# Patient Record
Sex: Female | Born: 1985 | Race: Black or African American | Hispanic: No | Marital: Single | Smoking: Never smoker
Health system: Southern US, Community
[De-identification: ages and names within clinical notes are randomized; demographics above are authoritative.]

## PROBLEM LIST (undated history)

## (undated) DIAGNOSIS — I639 Cerebral infarction, unspecified: Secondary | ICD-10-CM

## (undated) HISTORY — PX: TONSILLECTOMY: SUR1361

---

## 2014-09-24 ENCOUNTER — Inpatient Hospital Stay (HOSPITAL_COMMUNITY)
Admission: EM | Admit: 2014-09-24 | Discharge: 2014-09-27 | DRG: 064 | Disposition: A | Discharge status: Home / Self Care | Payer: Commercial Managed Care - HMO | Attending: Internal Medicine | Admitting: Internal Medicine

## 2014-09-24 ENCOUNTER — Encounter (HOSPITAL_COMMUNITY): Payer: Self-pay

## 2014-09-24 ENCOUNTER — Inpatient Hospital Stay (HOSPITAL_COMMUNITY): Payer: Commercial Managed Care - HMO

## 2014-09-24 ENCOUNTER — Emergency Department (HOSPITAL_COMMUNITY): Payer: Commercial Managed Care - HMO

## 2014-09-24 DIAGNOSIS — I7771 Dissection of carotid artery: Secondary | ICD-10-CM | POA: Diagnosis present

## 2014-09-24 DIAGNOSIS — I10 Essential (primary) hypertension: Secondary | ICD-10-CM | POA: Diagnosis present

## 2014-09-24 DIAGNOSIS — I638 Other cerebral infarction: Secondary | ICD-10-CM | POA: Diagnosis not present

## 2014-09-24 DIAGNOSIS — I639 Cerebral infarction, unspecified: Secondary | ICD-10-CM | POA: Diagnosis not present

## 2014-09-24 DIAGNOSIS — I63511 Cerebral infarction due to unspecified occlusion or stenosis of right middle cerebral artery: Principal | ICD-10-CM | POA: Diagnosis present

## 2014-09-24 DIAGNOSIS — R112 Nausea with vomiting, unspecified: Secondary | ICD-10-CM | POA: Diagnosis present

## 2014-09-24 DIAGNOSIS — D649 Anemia, unspecified: Secondary | ICD-10-CM | POA: Diagnosis present

## 2014-09-24 DIAGNOSIS — E785 Hyperlipidemia, unspecified: Secondary | ICD-10-CM | POA: Diagnosis present

## 2014-09-24 DIAGNOSIS — I6789 Other cerebrovascular disease: Secondary | ICD-10-CM | POA: Diagnosis not present

## 2014-09-24 DIAGNOSIS — Z91013 Allergy to seafood: Secondary | ICD-10-CM | POA: Diagnosis not present

## 2014-09-24 LAB — CBC
HEMATOCRIT: 35.2 % — AB (ref 36.0–46.0)
HEMATOCRIT: 32 % — AB (ref 36.0–46.0)
HEMOGLOBIN: 10.5 g/dL — AB (ref 12.0–15.0)
Hemoglobin: 11.1 g/dL — ABNORMAL LOW (ref 12.0–15.0)
MCH: 26.3 pg (ref 26.0–34.0)
MCH: 27.1 pg (ref 26.0–34.0)
MCHC: 31.5 g/dL (ref 30.0–36.0)
MCHC: 32.8 g/dL (ref 30.0–36.0)
MCV: 83.4 fL (ref 78.0–100.0)
MCV: 82.5 fL (ref 78.0–100.0)
PLATELETS: 255 10*3/uL (ref 150–400)
Platelets: 245 10*3/uL (ref 150–400)
RBC: 4.22 MIL/uL (ref 3.87–5.11)
RBC: 3.88 MIL/uL (ref 3.87–5.11)
RDW: 17.3 % — AB (ref 11.5–15.5)
RDW: 17.4 % — ABNORMAL HIGH (ref 11.5–15.5)
WBC: 9.2 10*3/uL (ref 4.0–10.5)
WBC: 8.6 10*3/uL (ref 4.0–10.5)

## 2014-09-24 LAB — CREATININE, SERUM: CREATININE: 0.69 mg/dL (ref 0.44–1.00)

## 2014-09-24 LAB — COMPREHENSIVE METABOLIC PANEL
ALK PHOS: 56 U/L (ref 38–126)
ALT: 15 U/L (ref 14–54)
AST: 15 U/L (ref 15–41)
Albumin: 4.3 g/dL (ref 3.5–5.0)
Anion gap: 6 (ref 5–15)
BILIRUBIN TOTAL: 0.5 mg/dL (ref 0.3–1.2)
BUN: 12 mg/dL (ref 6–20)
CALCIUM: 9.7 mg/dL (ref 8.9–10.3)
CO2: 27 mmol/L (ref 22–32)
Chloride: 108 mmol/L (ref 101–111)
Creatinine, Ser: 0.74 mg/dL (ref 0.44–1.00)
GFR calc Af Amer: >60 mL/min (ref >60)
Glucose, Bld: 102 mg/dL — ABNORMAL HIGH (ref 65–99)
POTASSIUM: 3.5 mmol/L (ref 3.5–5.1)
Sodium: 141 mmol/L (ref 135–145)
TOTAL PROTEIN: 8.5 g/dL — AB (ref 6.5–8.1)

## 2014-09-24 LAB — URINALYSIS, ROUTINE W REFLEX MICROSCOPIC
BILIRUBIN URINE: NEGATIVE
Glucose, UA: NEGATIVE mg/dL
HGB URINE DIPSTICK: NEGATIVE
KETONES UR: NEGATIVE mg/dL
Leukocytes, UA: NEGATIVE
NITRITE: NEGATIVE
PROTEIN: NEGATIVE mg/dL
SPECIFIC GRAVITY, URINE: 1.016 (ref 1.005–1.030)
UROBILINOGEN UA: 0.2 mg/dL (ref 0.0–1.0)
pH: 5.5 (ref 5.0–8.0)

## 2014-09-24 LAB — I-STAT BETA HCG BLOOD, ED (MC, WL, AP ONLY)

## 2014-09-24 LAB — PROTIME-INR
INR: 1.12 (ref 0.00–1.49)
Prothrombin Time: 14.6 seconds (ref 11.6–15.2)

## 2014-09-24 LAB — PREGNANCY, URINE: PREG TEST UR: NEGATIVE

## 2014-09-24 MED ORDER — POTASSIUM CHLORIDE IN NACL 20-0.45 MEQ/L-% IV SOLN
INTRAVENOUS | Status: DC
  Administered 2014-09-24: 22:00:00 via INTRAVENOUS
  Filled 2014-09-24 (×3): qty 1000

## 2014-09-24 MED ORDER — ONDANSETRON HCL 4 MG PO TABS: 4.0000 mg | ORAL_TABLET | Freq: Four times a day (QID) | ORAL | Status: DC | PRN

## 2014-09-24 MED ORDER — MORPHINE SULFATE (PF) 2 MG/ML IV SOLN
2.0000 mg | Freq: Once | INTRAVENOUS | Status: AC
  Administered 2014-09-24: 2 mg via INTRAVENOUS
  Filled 2014-09-24: qty 1

## 2014-09-24 MED ORDER — PROMETHAZINE HCL 25 MG/ML IJ SOLN
25.0000 mg | Freq: Once | INTRAMUSCULAR | Status: AC
  Administered 2014-09-24: 25 mg via INTRAVENOUS
  Filled 2014-09-24 (×2): qty 1

## 2014-09-24 MED ORDER — IOHEXOL 350 MG/ML SOLN
100.0000 mL | Freq: Once | INTRAVENOUS | Status: AC | PRN
  Administered 2014-09-24: 100 mL via INTRAVENOUS

## 2014-09-24 MED ORDER — STROKE: EARLY STAGES OF RECOVERY BOOK
Freq: Once | Status: DC
  Filled 2014-09-24: qty 1

## 2014-09-24 MED ORDER — ASPIRIN EC 325 MG PO TBEC
325.0000 mg | DELAYED_RELEASE_TABLET | Freq: Every day | ORAL | Status: DC
  Administered 2014-09-25 – 2014-09-27 (×3): 325 mg via ORAL
  Filled 2014-09-24 (×3): qty 1

## 2014-09-24 MED ORDER — ONDANSETRON HCL 4 MG/2ML IJ SOLN
4.0000 mg | Freq: Once | INTRAMUSCULAR | Status: AC
  Administered 2014-09-24: 4 mg via INTRAVENOUS
  Filled 2014-09-24: qty 2

## 2014-09-24 MED ORDER — PROMETHAZINE HCL 25 MG/ML IJ SOLN: 12.5000 mg | Freq: Four times a day (QID) | INTRAMUSCULAR | Status: DC | PRN

## 2014-09-24 MED ORDER — ENOXAPARIN SODIUM 40 MG/0.4ML ~~LOC~~ SOLN
40.0000 mg | SUBCUTANEOUS | Status: DC
  Administered 2014-09-24 – 2014-09-26 (×3): 40 mg via SUBCUTANEOUS
  Filled 2014-09-24 (×5): qty 0.4

## 2014-09-24 MED ORDER — SENNOSIDES-DOCUSATE SODIUM 8.6-50 MG PO TABS: 1.0000 | ORAL_TABLET | Freq: Every evening | ORAL | Status: DC | PRN

## 2014-09-24 MED ORDER — SODIUM CHLORIDE 0.9 % IV BOLUS (SEPSIS)
1000.0000 mL | Freq: Once | INTRAVENOUS | Status: AC
  Administered 2014-09-24: 1000 mL via INTRAVENOUS

## 2014-09-24 MED ORDER — ASPIRIN 81 MG PO CHEW
324.0000 mg | CHEWABLE_TABLET | Freq: Once | ORAL | Status: AC
  Administered 2014-09-24: 324 mg via ORAL
  Filled 2014-09-24: qty 4

## 2014-09-24 MED ORDER — SODIUM CHLORIDE 0.9 % IV BOLUS (SEPSIS)
500.0000 mL | Freq: Once | INTRAVENOUS | Status: AC
  Administered 2014-09-24: 500 mL via INTRAVENOUS

## 2014-09-24 MED ORDER — SODIUM CHLORIDE 0.9 % IJ SOLN
3.0000 mL | Freq: Two times a day (BID) | INTRAMUSCULAR | Status: DC
  Administered 2014-09-24 – 2014-09-27 (×5): 3 mL via INTRAVENOUS

## 2014-09-24 MED ORDER — ONDANSETRON HCL 4 MG/2ML IJ SOLN: 4.0000 mg | Freq: Four times a day (QID) | INTRAMUSCULAR | Status: DC | PRN

## 2014-09-24 NOTE — Consult Note (Addendum)
Reason for Consult:Headache, paresthesias Referring Physician: Robb Matar  CC: Headache paresthesias  HPI: Alexandria [name] is an 29 y.o. female who reports that on Monday she rode a roller coaster.  Afterward she developed a headache, her hands went numb bilaterally, her hands started to twitch (left greater than right), she was dizzy and off balance.  This lasted for a few hours, she went to bed and when she awakened the next day she still had the headache. She massaged her head and vomited.  This improved her symptoms but since that day she has had her symptoms intermittently and has continued to have the headache.  She has vomited further as well.  With no resolution of her symptoms she presented today for evaluation.    History reviewed. No pertinent past medical history.  Past Surgical History  Procedure Laterality Date  . Tonsillectomy      Family History  Problem Relation Age of Onset  . Breast cancer Mother   . Diabetes Mellitus II Mother     Social History:  reports that she has never smoked. She does not have any smokeless tobacco history on file. She reports that she drinks alcohol. She reports that she does not use illicit drugs.  Allergies  Allergen Reactions  . Fish-Derived Products Nausea Only    Patient stated she gets very nauseated just smelling seafood.    Medications:  I have reviewed the patient's current medications. Prior to Admission:  Prescriptions prior to admission  Medication Sig Dispense Refill Last Dose  . DM-Doxylamine-Acetaminophen (NYQUIL COLD & FLU PO) Take 15 mLs by mouth once.   09/20/2014  . Multiple Vitamin (MULTIVITAMIN WITH MINERALS) TABS tablet Take 1 tablet by mouth daily.   09/20/2014   Scheduled: .  stroke: mapping our early stages of recovery book   Does not apply Once  . aspirin EC  325 mg Oral Daily  . enoxaparin (LOVENOX) injection  40 mg Subcutaneous Q24H  . sodium chloride  3 mL Intravenous Q12H    ROS: History obtained from the  patient  General ROS: negative for - chills, fatigue, fever, night sweats, weight gain or weight loss Psychological ROS: negative for - behavioral disorder, hallucinations, memory difficulties, mood swings or suicidal ideation Ophthalmic ROS: negative for - blurry vision, double vision, eye pain or loss of vision ENT ROS: as noted in HPI Allergy and Immunology ROS: negative for - hives or itchy/watery eyes Hematological and Lymphatic ROS: negative for - bleeding problems, bruising or swollen lymph nodes Endocrine ROS: negative for - galactorrhea, hair pattern changes, polydipsia/polyuria or temperature intolerance Respiratory ROS: negative for - cough, hemoptysis, shortness of breath or wheezing Cardiovascular ROS: negative for - chest pain, dyspnea on exertion, edema or irregular heartbeat Gastrointestinal ROS: as noted in HPI Genito-Urinary ROS: negative for - dysuria, hematuria, incontinence or urinary frequency/urgency Musculoskeletal ROS: negative for - joint swelling or muscular weakness Neurological ROS: as noted in HPI Dermatological ROS: negative for rash and skin lesion changes  Physical Examination: Blood pressure 174/103, pulse 75, temperature 99.2 F (37.3 C), temperature source Oral, resp. rate 15, height  (1.702 m), weight 85.5 kg (188 lb 7.9 oz), last menstrual period 08/19/2014, SpO2 99 %.  HEENT-  Normocephalic, no lesions, without obvious abnormality.  Normal external eye and conjunctiva.  Normal TM's bilaterally.  Normal auditory canals and external ears. Normal external nose, mucus membranes and septum.  Normal pharynx. Cardiovascular- S1, S2 normal, pulses palpable throughout   Lungs- chest clear, no wheezing, rales, normal  symmetric air entry Abdomen- soft, non-tender; bowel sounds normal; no masses,  no organomegaly Extremities- no edema Lymph-no adenopathy palpable Musculoskeletal-no joint tenderness, deformity or swelling Skin-warm and dry, no  hyperpigmentation, vitiligo, or suspicious lesions  Neurological Examination Mental Status: Alert, oriented, thought content appropriate.  Speech fluent without evidence of aphasia.  Able to follow 3 step commands without difficulty. Cranial Nerves: II: Discs flat bilaterally; Visual fields grossly normal, pupils equal, round, reactive to light and accommodation III,IV, VI: ptosis not present, extra-ocular motions intact bilaterally V,VII: smile symmetric, facial light touch sensation normal bilaterally VIII: hearing normal bilaterally IX,X: gag reflex present XI: bilateral shoulder shrug XII: midline tongue extension Motor: Right : Upper extremity   5/5    Left:     Upper extremity   5/5  Lower extremity   5/5     Lower extremity   5/5 Tone and bulk:normal tone throughout; no atrophy noted Sensory: Pinprick and light touch intact throughout, bilaterally Deep Tendon Reflexes: 2+ and symmetric throughout Plantars: Right: downgoing   Left: downgoing Cerebellar: normal finger-to-nose, normal rapid alternating movements and normal heel-to-shin test Gait: normal gait and station      Laboratory Studies:   Basic Metabolic Panel:  Recent Labs Lab 09/24/14 1553  NA 141  K 3.5  CL 108  CO2 27  GLUCOSE 102*  BUN 12  CREATININE 0.74  CALCIUM 9.7    Liver Function Tests:  Recent Labs Lab 09/24/14 1553  AST 15  ALT 15  ALKPHOS 56  BILITOT 0.5  PROT 8.5*  ALBUMIN 4.3   No results for input(s): LIPASE, AMYLASE in the last 168 hours. No results for input(s): AMMONIA in the last 168 hours.  CBC:  Recent Labs Lab 09/24/14 1553 09/24/14 2234  WBC 9.2 8.6  HGB 11.1* 10.5*  HCT 35.2* 32.0*  MCV 83.4 82.5  PLT 255 245    Cardiac Enzymes: No results for input(s): CKTOTAL, CKMB, CKMBINDEX, TROPONINI in the last 168 hours.  BNP: Invalid input(s): POCBNP  CBG: No results for input(s): GLUCAP in the last 168 hours.  Microbiology: No results found for this or any  previous visit.  Coagulation Studies:  Recent Labs  09/24/14 1700  LABPROT 14.6  INR 1.12    Urinalysis:  Recent Labs Lab 09/24/14 1740  COLORURINE YELLOW  LABSPEC 1.016  PHURINE 5.5  GLUCOSEU NEGATIVE  HGBUR NEGATIVE  BILIRUBINUR NEGATIVE  KETONESUR NEGATIVE  PROTEINUR NEGATIVE  UROBILINOGEN 0.2  NITRITE NEGATIVE  LEUKOCYTESUR NEGATIVE    Lipid Panel:  No results found for: CHOL, TRIG, HDL, CHOLHDL, VLDL, LDLCALC  HgbA1C: No results found for: HGBA1C  Urine Drug Screen:  No results found for: LABOPIA, COCAINSCRNUR, LABBENZ, AMPHETMU, THCU, LABBARB  Alcohol Level: No results for input(s): ETH in the last 168 hours.   Imaging: Ct Angio Head W/cm &/or Wo Cm  09/24/2014   CLINICAL DATA:  Headache following roller coaster ride. The patient has had headache, intermittent nausea vomiting, and intermittent bilateral upper extremity numbness for the past 5 days following the ride.  EXAM: CT ANGIOGRAPHY HEAD AND NECK  TECHNIQUE: Multidetector CT imaging of the head and neck was performed using the standard protocol during bolus administration of intravenous contrast. Multiplanar CT image reconstructions and MIPs were obtained to evaluate the vascular anatomy. Carotid stenosis measurements (when applicable) are obtained utilizing NASCET criteria, using the distal internal carotid diameter as the denominator.  CONTRAST:  OMNIPAQUE IOHEXOL 350 MG/ML SOLN  COMPARISON:  None.  FINDINGS: CT HEAD  Brain: No acute infarct, hemorrhage, or mass lesion is present. The ventricles are of normal size. There is no significant white matter disease. No significant extraaxial fluid collection is present.  Calvarium and skull base: Within normal limits  Paranasal sinuses: Cleared  Orbits: The globes and orbits are intact.  CTA NECK  Aortic arch: A 3 vessel arch configuration is present. There is no significant stenosis at the arch.  Right carotid system: The right common carotid artery is within  normal limits. The bifurcation is unremarkable. The right internal carotid artery tapers to occlusion 15 mm beyond the bifurcation compatible with a focal dissection the arteries not reconstituted in the neck.  Left carotid system: The left common carotid artery is within normal limits. The bifurcation is unremarkable. The cervical left ICA is normal.  Vertebral arteries:The vertebral arteries both originate from the subclavian arteries. The vertebral arteries are codominant. No focal vascular injury is present in the neck. There is some venous contamination in the neck.  Skeleton: Vertebral body heights and alignment are maintained. No acute fracture or traumatic subluxation is present. There is some reversal of the normal cervical lordosis. This may be positional.  Other neck: The soft tissues the neck are otherwise unremarkable. The lung apices are clear. The thyroid is within normal limits. Salivary glands are intact.  CTA HEAD  Anterior circulation: The right internal carotid artery is reconstituted at the level of the ophthalmic artery and posterior communicating artery. The terminal right ICA is small. The left internal carotid artery is within normal limits from the high cervical segments through the ICA terminus. The A1 and M1 segments are within normal limits bilaterally. The anterior communicating artery is patent. ACA and MCA branch vessels are intact.  Posterior circulation: The vertebral arteries are codominant. The basilar artery is within normal limits. Both posterior cerebral arteries originate from the basilar tip. Posterior communicating arteries contribute. The PCA branch vessels are within normal limits.  Venous sinuses: The dural sinuses are patent. The transverse sinuses are codominant. Straight sinus and deep cerebral veins are patent. Cortical veins are intact.  Anatomic variants: None  Delayed phase: No pathologic enhancement is present.  IMPRESSION: 1. 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 2. The left internal carotid artery is reconstituted at the level of the left ophthalmic artery and posterior communicating artery. 3. The A1 and M1 segments are symmetric. 4. No other focal vascular injury or intracranial disease. These results were called by telephone at the time of interpretation on 09/24/2014 at 6:14 pm to Dr. Margarita Grizzle , who verbally acknowledged these results.   Electronically Signed   By: Marin Roberts M.D.   On: 09/24/2014 18:15   Ct Angio Neck W/cm &/or Wo/cm  09/24/2014   CLINICAL DATA:  Headache following roller coaster ride. The patient has had headache, intermittent nausea vomiting, and intermittent bilateral upper extremity numbness for the past 5 days following the ride.  EXAM: CT ANGIOGRAPHY HEAD AND NECK  TECHNIQUE: Multidetector CT imaging of the head and neck was performed using the standard protocol during bolus administration of intravenous contrast. Multiplanar CT image reconstructions and MIPs were obtained to evaluate the vascular anatomy. Carotid stenosis measurements (when applicable) are obtained utilizing NASCET criteria, using the distal internal carotid diameter as the denominator.  CONTRAST:  OMNIPAQUE IOHEXOL 350 MG/ML SOLN  COMPARISON:  None.  FINDINGS: CT HEAD  Brain: No acute infarct, hemorrhage, or mass lesion is present. The ventricles are  of normal size. There is no significant white matter disease. No significant extraaxial fluid collection is present.  Calvarium and skull base: Within normal limits  Paranasal sinuses: Cleared  Orbits: The globes and orbits are intact.  CTA NECK  Aortic arch: A 3 vessel arch configuration is present. There is no significant stenosis at the arch.  Right carotid system: The right common carotid artery is within normal limits. The bifurcation is unremarkable. The right internal carotid artery tapers to occlusion 15 mm beyond the bifurcation compatible  with a focal dissection the arteries not reconstituted in the neck.  Left carotid system: The left common carotid artery is within normal limits. The bifurcation is unremarkable. The cervical left ICA is normal.  Vertebral arteries:The vertebral arteries both originate from the subclavian arteries. The vertebral arteries are codominant. No focal vascular injury is present in the neck. There is some venous contamination in the neck.  Skeleton: Vertebral body heights and alignment are maintained. No acute fracture or traumatic subluxation is present. There is some reversal of the normal cervical lordosis. This may be positional.  Other neck: The soft tissues the neck are otherwise unremarkable. The lung apices are clear. The thyroid is within normal limits. Salivary glands are intact.  CTA HEAD  Anterior circulation: The right internal carotid artery is reconstituted at the level of the ophthalmic artery and posterior communicating artery. The terminal right ICA is small. The left internal carotid artery is within normal limits from the high cervical segments through the ICA terminus. The A1 and M1 segments are within normal limits bilaterally. The anterior communicating artery is patent. ACA and MCA branch vessels are intact.  Posterior circulation: The vertebral arteries are codominant. The basilar artery is within normal limits. Both posterior cerebral arteries originate from the basilar tip. Posterior communicating arteries contribute. The PCA branch vessels are within normal limits.  Venous sinuses: The dural sinuses are patent. The transverse sinuses are codominant. Straight sinus and deep cerebral veins are patent. Cortical veins are intact.  Anatomic variants: None  Delayed phase: No pathologic enhancement is present.  IMPRESSION: 1. 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 2. The left internal carotid artery is reconstituted at the level of the left  ophthalmic artery and posterior communicating artery. 3. The A1 and M1 segments are symmetric. 4. No other focal vascular injury or intracranial disease. These results were called by telephone at the time of interpretation on 09/24/2014 at 6:14 pm to Dr. Margarita Grizzle , who verbally acknowledged these results.   Electronically Signed   By: Marin Roberts M.D.   On: 09/24/2014 18:15     Assessment/Plan: 29 year old female with a constellation of symptoms including headache, hand numbness, dizziness and gait instability after a roller coaster ride.  Neurological examination is nonfocal at this time.  CTA of the head and neck reviewed and shows a RICA dissection 1.5cm above the bifurcation.  Concern is for the intermittent neurological symptoms she has had throughout the week.    Recommendations: 1.  MRI of the brain without contrast.   2.  Frequent neuro checks 3.  Zofran X 1 for nausea, vomiting 4.  ASA 325mg  daily.  If recurrent neurological symptoms on ASA will consider initiation of heparin.     Thana Farr, MD [facility] [phone] 09/24/2014, 11:01 PM

## 2014-09-24 NOTE — H&P (Signed)
[facility] History and Physical  Alexandria [name] WUJ:[id number] DOB: [birth year] DOA: 09/24/2014  Referring physician: Margarita Grizzle, MD PCP: No primary care provider on file.   Chief Complaint: Headache and numbness.  HPI: Alexandria [name] is a 29 y.o. female with no past medical history who comes to the hospital due to headache, right upper extremity numbness, nausea and vomiting. Per patient, on Monday she had a ride at an amusement park which had the passengers moving up and down and side words during the actual ride and she states that during this time she developed a left-sided and occipital headache associated with nausea. She also complains of having numbness on both of her upper extremities. She has some nausea and emesis the following days, but her headache seems to have improved during the week. However today patient was outside and noticed the her headache recurred and he was more intense than he had been earlier. She denies vision changes, weakness, but her mother reports mild slurred speech for a while. She denies fever, chills, dyspnea, chest pain, palpitations, diaphoresis, lower extremity edema, tinnitus or vertigo.  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 She is currently in no acute distress, she states that her pain is better, but still complains of having some nausea.   Review of Systems:  Constitutional:  No weight loss, night sweats, Fevers, chills, fatigue.  HEENT:  Positive headaches,  Negative Difficulty swallowing,Tooth/dental problems,Sore throat,  No sneezing, itching, ear ache, nasal congestion, post nasal drip,  Cardio-vascular:  No chest pain, Orthopnea, PND, swelling in lower extremities, anasarca, dizziness, palpitations  GI:  Positive nausea, vomiting, No heartburn, indigestion, abdominal pain, diarrhea, change in bowel habits, loss of appetite  Resp:  No shortness of breath with exertion or at rest. No excess mucus, no  productive cough, No non-productive cough, No coughing up of blood.No change in color of mucus.No wheezing.No chest wall deformity  Skin:  no rash or lesions.  GU:  no dysuria, change in color of urine, no urgency or frequency. No flank pain.  Musculoskeletal:  No joint pain or swelling. No decreased range of motion. No back pain.  Psych:  No change in mood or affect. No depression or anxiety. No memory loss.  Neuro: as above.  History reviewed. No pertinent past medical history. Past Surgical History  Procedure Laterality Date  . Tonsillectomy     Social History:  reports that she has never smoked. She does not have any smokeless tobacco history on file. She reports that she drinks alcohol. She reports that she does not use illicit drugs.  No Known Allergies  Family History  Problem Relation Age of Onset  . Breast cancer Mother   . Diabetes Mellitus II Mother     Prior to Admission medications   Medication Sig Start Date End Date Taking? Authorizing Provider  DM-Doxylamine-Acetaminophen (NYQUIL COLD & FLU PO) Take 15 mLs by mouth once.   Yes Historical Provider, MD  Multiple Vitamin (MULTIVITAMIN WITH MINERALS) TABS tablet Take 1 tablet by mouth daily.   Yes Historical Provider, MD   Physical Exam: Filed Vitals:   09/24/14 1915 09/24/14 1930 09/24/14 2006 09/24/14 2030  BP: 145/80 154/75 146/83 140/68  Pulse: 96 82 83 79  Temp:      TempSrc:      Resp: SpO2: 100% 100% 100% 100%    Wt Readings from Last 3 Encounters:  No data found for Wt    General:  Appears calm and comfortable Eyes: PERRL, normal lids, irises & conjunctiva ENT: grossly normal hearing, lips & tongue Neck: no LAD, masses or thyromegaly Cardiovascular: RRR, no m/r/g. No LE edema. Telemetry: SR, no arrhythmias  Respiratory: CTA bilaterally, no w/r/r. Normal respiratory effort. Abdomen: soft, ntnd Skin: no rash or induration seen on limited exam Musculoskeletal: grossly normal tone  BUE/BLE Psychiatric: grossly normal mood and affect, speech fluent and appropriate Neurologic: grossly non-focal.          Labs on Admission:  Basic Metabolic Panel:  Recent Labs Lab 09/24/14 1553  NA 141  K 3.5  CL 108  CO2 27  GLUCOSE 102*  BUN 12  CREATININE 0.74  CALCIUM 9.7   Liver Function Tests:  Recent Labs Lab 09/24/14 1553  AST 15  ALT 15  ALKPHOS 56  BILITOT 0.5  PROT 8.5*  ALBUMIN 4.3   CBC:  Recent Labs Lab 09/24/14 1553  WBC 9.2  HGB 11.1*  HCT 35.2*  MCV 83.4  PLT 255    Radiological Exams on Admission: Ct Angio Head W/cm &/or Wo Cm  09/24/2014   CLINICAL DATA:  Headache following roller coaster ride. The patient has had headache, intermittent nausea vomiting, and intermittent bilateral upper extremity numbness for the past 5 days following the ride.  EXAM: CT ANGIOGRAPHY HEAD AND NECK  TECHNIQUE: Multidetector CT imaging of the head and neck was performed using the standard protocol during bolus administration of intravenous contrast. Multiplanar CT image reconstructions and MIPs were obtained to evaluate the vascular anatomy. Carotid stenosis measurements (when applicable) are obtained utilizing NASCET criteria, using the distal internal carotid diameter as the denominator.  CONTRAST:  OMNIPAQUE IOHEXOL 350 MG/ML SOLN  COMPARISON:  None.  FINDINGS: CT HEAD  Brain: No acute infarct, hemorrhage, or mass lesion is present. The ventricles are of normal size. There is no significant white matter disease. No significant extraaxial fluid collection is present.  Calvarium and skull base: Within normal limits  Paranasal sinuses: Cleared  Orbits: The globes and orbits are intact.  CTA NECK  Aortic arch: A 3 vessel arch configuration is present. There is no significant stenosis at the arch.  Right carotid system: The right common carotid artery is within normal limits. The bifurcation is unremarkable. The right internal carotid artery tapers to occlusion 15  mm beyond the bifurcation compatible with a focal dissection the arteries not reconstituted in the neck.  Left carotid system: The left common carotid artery is within normal limits. The bifurcation is unremarkable. The cervical left ICA is normal.  Vertebral arteries:The vertebral arteries both originate from the subclavian arteries. The vertebral arteries are codominant. No focal vascular injury is present in the neck. There is some venous contamination in the neck.  Skeleton: Vertebral body heights and alignment are maintained. No acute fracture or traumatic subluxation is present. There is some reversal of the normal cervical lordosis. This may be positional.  Other neck: The soft tissues the neck are otherwise unremarkable. The lung apices are clear. The thyroid is within normal limits. Salivary glands are intact.  CTA HEAD  Anterior circulation: The right internal carotid artery is reconstituted at the level of the ophthalmic artery and posterior communicating artery. The terminal right ICA is small. The left internal carotid artery is within normal limits from the high cervical segments through the ICA terminus. The A1 and M1 segments are within normal limits bilaterally. The anterior communicating artery is patent. ACA and MCA branch vessels are intact.  Posterior  circulation: The vertebral arteries are codominant. The basilar artery is within normal limits. Both posterior cerebral arteries originate from the basilar tip. Posterior communicating arteries contribute. The PCA branch vessels are within normal limits.  Venous sinuses: The dural sinuses are patent. The transverse sinuses are codominant. Straight sinus and deep cerebral veins are patent. Cortical veins are intact.  Anatomic variants: None  Delayed phase: No pathologic enhancement is present.  IMPRESSION: 1. 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 2. The left internal carotid artery is  reconstituted at the level of the left ophthalmic artery and posterior communicating artery. 3. The A1 and M1 segments are symmetric. 4. No other focal vascular injury or intracranial disease. These results were called by telephone at the time of interpretation on 09/24/2014 at 6:14 pm to Dr. Margarita Grizzle , who verbally acknowledged these results.   Electronically Signed   By: Marin Roberts M.D.   On: 09/24/2014 18:15   Ct Angio Neck W/cm &/or Wo/cm  09/24/2014   CLINICAL DATA:  Headache following roller coaster ride. The patient has had headache, intermittent nausea vomiting, and intermittent bilateral upper extremity numbness for the past 5 days following the ride.  EXAM: CT ANGIOGRAPHY HEAD AND NECK  TECHNIQUE: Multidetector CT imaging of the head and neck was performed using the standard protocol during bolus administration of intravenous contrast. Multiplanar CT image reconstructions and MIPs were obtained to evaluate the vascular anatomy. Carotid stenosis measurements (when applicable) are obtained utilizing NASCET criteria, using the distal internal carotid diameter as the denominator.  CONTRAST:  OMNIPAQUE IOHEXOL 350 MG/ML SOLN  COMPARISON:  None.  FINDINGS: CT HEAD  Brain: No acute infarct, hemorrhage, or mass lesion is present. The ventricles are of normal size. There is no significant white matter disease. No significant extraaxial fluid collection is present.  Calvarium and skull base: Within normal limits  Paranasal sinuses: Cleared  Orbits: The globes and orbits are intact.  CTA NECK  Aortic arch: A 3 vessel arch configuration is present. There is no significant stenosis at the arch.  Right carotid system: The right common carotid artery is within normal limits. The bifurcation is unremarkable. The right internal carotid artery tapers to occlusion 15 mm beyond the bifurcation compatible with a focal dissection the arteries not reconstituted in the neck.  Left carotid system: The left  common carotid artery is within normal limits. The bifurcation is unremarkable. The cervical left ICA is normal.  Vertebral arteries:The vertebral arteries both originate from the subclavian arteries. The vertebral arteries are codominant. No focal vascular injury is present in the neck. There is some venous contamination in the neck.  Skeleton: Vertebral body heights and alignment are maintained. No acute fracture or traumatic subluxation is present. There is some reversal of the normal cervical lordosis. This may be positional.  Other neck: The soft tissues the neck are otherwise unremarkable. The lung apices are clear. The thyroid is within normal limits. Salivary glands are intact.  CTA HEAD  Anterior circulation: The right internal carotid artery is reconstituted at the level of the ophthalmic artery and posterior communicating artery. The terminal right ICA is small. The left internal carotid artery is within normal limits from the high cervical segments through the ICA terminus. The A1 and M1 segments are within normal limits bilaterally. The anterior communicating artery is patent. ACA and MCA branch vessels are intact.  Posterior circulation: The vertebral arteries are codominant. The basilar artery is within normal limits.  Both posterior cerebral arteries originate from the basilar tip. Posterior communicating arteries contribute. The PCA branch vessels are within normal limits.  Venous sinuses: The dural sinuses are patent. The transverse sinuses are codominant. Straight sinus and deep cerebral veins are patent. Cortical veins are intact.  Anatomic variants: None  Delayed phase: No pathologic enhancement is present.  IMPRESSION: 1. 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 2. The left internal carotid artery is reconstituted at the level of the left ophthalmic artery and posterior communicating artery. 3. The A1 and M1 segments are symmetric. 4. No  other focal vascular injury or intracranial disease. These results were called by telephone at the time of interpretation on 09/24/2014 at 6:14 pm to Dr. Margarita Grizzle , who verbally acknowledged these results.   Electronically Signed   By: Marin Roberts M.D.   On: 09/24/2014 18:15     Assessment/Plan Principal Problem:   Carotid artery dissection Neuro exam is grossly normal. Admit to [facility] for further evaluation and neurology consult. Check carotid Doppler. Check echocardiogram. Check fasting lipid profile and hemoglobin A1c.  Active Problems:   Anemia Check anemia workup. Monitor H&H.    Nausea and vomiting Continue antiemetics and IV hydration.   Dr. Hosie Poisson from neurology has been consulted by the emergency department and is aware of the patient.  Code Status: Full code. DVT Prophylaxis: Lovenox SQ. Family Communication:  Disposition Plan: Admit to [facility] for further evaluation and neurology evaluation.  Time spent: Over 70 minutes were spent during this admission including discussing the case with the ED medical staff, reviewing the case, interviewing and examining the patient and coordinating care and transfer to [facility]  Bobette Mo [facility] Pager [phone].

## 2014-09-24 NOTE — ED Provider Notes (Addendum)
CSN: [id number]     Arrival date & time 09/24/14  1457 History   First MD Initiated Contact with Patient 09/24/14 1617     Chief Complaint  Patient presents with  . Headache  . Numbness     (Consider location/radiation/quality/duration/timing/severity/associated sxs/prior Treatment) HPI  29 year old female presents today complaining of headache. She states that Monday she went on a roller coaster and one hour later began having some neck pain with bilateral upper extremity paresthesias. She states these basically resolved but the headache has continued. She has waxed and waned. Today she was at an outing when she began having a severe headache on the right side. She states this is the side it has been on but it is worse. Now a 10 out of 10. She denies any visual changes, weakness, or difficulty speaking. She states she has felt very weak and had some difficulty walking due to this. She denies any similar symptoms in the past. She denies fever or chills.  History reviewed. No pertinent past medical history. Past Surgical History  Procedure Laterality Date  . Tonsillectomy     History reviewed. No pertinent family history. Social History  Substance Use Topics  . Smoking status: Never Smoker   . Smokeless tobacco: None  . Alcohol Use: Yes     Comment: socially   OB History    No data available     Review of Systems  All other systems reviewed and are negative.     Allergies  Review of patient's allergies indicates no known allergies.  Home Medications   Prior to Admission medications   Medication Sig Start Date End Date Taking? Authorizing Provider  DM-Doxylamine-Acetaminophen (NYQUIL COLD & FLU PO) Take 15 mLs by mouth once.   Yes Historical Provider, MD  Multiple Vitamin (MULTIVITAMIN WITH MINERALS) TABS tablet Take 1 tablet by mouth daily.   Yes Historical Provider, MD   BP 142/79 mmHg  Pulse 75  Temp(Src) 97.7 F (36.5 C) (Oral)  Resp 16  SpO2 100%  LMP  08/19/2014 Physical Exam  Constitutional: She is oriented to person, place, and time. She appears well-developed and well-nourished.  HENT:  Head: Normocephalic and atraumatic.  Right Ear: Tympanic membrane and external ear normal.  Left Ear: Tympanic membrane and external ear normal.  Nose: Nose normal. Right sinus exhibits no maxillary sinus tenderness and no frontal sinus tenderness. Left sinus exhibits no maxillary sinus tenderness and no frontal sinus tenderness.  Eyes: Conjunctivae and EOM are normal. Pupils are equal, round, and reactive to light. Right eye exhibits no nystagmus. Left eye exhibits no nystagmus.  Neck: Normal range of motion. Neck supple.  Cardiovascular: Normal rate, regular rhythm, normal heart sounds and intact distal pulses.   Pulmonary/Chest: Effort normal and breath sounds normal. No respiratory distress. She exhibits no tenderness.  Abdominal: Soft. Bowel sounds are normal. She exhibits no distension and no mass. There is no tenderness.  Musculoskeletal: Normal range of motion. She exhibits no edema or tenderness.  Neurological: She is alert and oriented to person, place, and time. She has normal strength and normal reflexes. No sensory deficit. She displays a negative Romberg sign. GCS eye subscore is 4. GCS verbal subscore is 5. GCS motor subscore is 6.  Reflex Scores:      Tricep reflexes are 2+ on the right side and 2+ on the left side.      Bicep reflexes are 2+ on the right side and 2+ on the left side.  Brachioradialis reflexes are 2+ on the right side and 2+ on the left side.      Patellar reflexes are 2+ on the right side and 2+ on the left side.      Achilles reflexes are 2+ on the right side and 2+ on the left side. Patient with normal gait without ataxia, shuffling, spasm, or antalgia. Speech is normal without dysarthria, dysphasia, or aphasia. Muscle strength is 5/5 in bilateral shoulders, elbow flexor and extensors, wrist flexor and extensors,  and intrinsic hand muscles. 5/5 bilateral lower extremity hip flexors, extensors, knee flexors and extensors, and ankle dorsi and plantar flexors.    Skin: Skin is warm and dry. No rash noted.  Psychiatric: She has a normal mood and affect. Her behavior is normal. Judgment and thought content normal.  Nursing note and vitals reviewed.   ED Course  Procedures (including critical care time) Labs Review Labs Reviewed  COMPREHENSIVE METABOLIC PANEL - Abnormal; Notable for the following:    Glucose, Bld 102 (*)    Total Protein 8.5 (*)    All other components within normal limits  CBC - Abnormal; Notable for the following:    Hemoglobin 11.1 (*)    HCT 35.2 (*)    RDW 17.3 (*)    All other components within normal limits  URINALYSIS, ROUTINE W REFLEX MICROSCOPIC (NOT AT [facility])  PROTIME-INR  I-STAT BETA HCG BLOOD, ED ([facility], [facility], [facility] ONLY)    Imaging Review Ct Angio Head W/cm &/or Wo Cm  09/24/2014   CLINICAL DATA:  Headache following roller coaster ride. The patient has had headache, intermittent nausea vomiting, and intermittent bilateral upper extremity numbness for the past 5 days following the ride.  EXAM: CT ANGIOGRAPHY HEAD AND NECK  TECHNIQUE: Multidetector CT imaging of the head and neck was performed using the standard protocol during bolus administration of intravenous contrast. Multiplanar CT image reconstructions and MIPs were obtained to evaluate the vascular anatomy. Carotid stenosis measurements (when applicable) are obtained utilizing NASCET criteria, using the distal internal carotid diameter as the denominator.  CONTRAST:  OMNIPAQUE IOHEXOL 350 MG/ML SOLN  COMPARISON:  None.  FINDINGS: CT HEAD  Brain: No acute infarct, hemorrhage, or mass lesion is present. The ventricles are of normal size. There is no significant white matter disease. No significant extraaxial fluid collection is present.  Calvarium and skull base: Within normal limits  Paranasal sinuses: Cleared  Orbits:  The globes and orbits are intact.  CTA NECK  Aortic arch: A 3 vessel arch configuration is present. There is no significant stenosis at the arch.  Right carotid system: The right common carotid artery is within normal limits. The bifurcation is unremarkable. The right internal carotid artery tapers to occlusion 15 mm beyond the bifurcation compatible with a focal dissection the arteries not reconstituted in the neck.  Left carotid system: The left common carotid artery is within normal limits. The bifurcation is unremarkable. The cervical left ICA is normal.  Vertebral arteries:The vertebral arteries both originate from the subclavian arteries. The vertebral arteries are codominant. No focal vascular injury is present in the neck. There is some venous contamination in the neck.  Skeleton: Vertebral body heights and alignment are maintained. No acute fracture or traumatic subluxation is present. There is some reversal of the normal cervical lordosis. This may be positional.  Other neck: The soft tissues the neck are otherwise unremarkable. The lung apices are clear. The thyroid is within normal limits. Salivary glands are intact.  CTA HEAD  Anterior circulation: The right internal carotid artery is reconstituted at the level of the ophthalmic artery and posterior communicating artery. The terminal right ICA is small. The left internal carotid artery is within normal limits from the high cervical segments through the ICA terminus. The A1 and M1 segments are within normal limits bilaterally. The anterior communicating artery is patent. ACA and MCA branch vessels are intact.  Posterior circulation: The vertebral arteries are codominant. The basilar artery is within normal limits. Both posterior cerebral arteries originate from the basilar tip. Posterior communicating arteries contribute. The PCA branch vessels are within normal limits.  Venous sinuses: The dural sinuses are patent. The transverse sinuses are codominant.  Straight sinus and deep cerebral veins are patent. Cortical veins are intact.  Anatomic variants: None  Delayed phase: No pathologic enhancement is present.  IMPRESSION: 1. 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 2. The left internal carotid artery is reconstituted at the level of the left ophthalmic artery and posterior communicating artery. 3. The A1 and M1 segments are symmetric. 4. No other focal vascular injury or intracranial disease. These results were called by telephone at the time of interpretation on 09/24/2014 at 6:14 pm to Dr. Margarita Grizzle , who verbally acknowledged these results.   Electronically Signed   By: Marin Roberts M.D.   On: 09/24/2014 18:15   Ct Angio Neck W/cm &/or Wo/cm  09/24/2014   CLINICAL DATA:  Headache following roller coaster ride. The patient has had headache, intermittent nausea vomiting, and intermittent bilateral upper extremity numbness for the past 5 days following the ride.  EXAM: CT ANGIOGRAPHY HEAD AND NECK  TECHNIQUE: Multidetector CT imaging of the head and neck was performed using the standard protocol during bolus administration of intravenous contrast. Multiplanar CT image reconstructions and MIPs were obtained to evaluate the vascular anatomy. Carotid stenosis measurements (when applicable) are obtained utilizing NASCET criteria, using the distal internal carotid diameter as the denominator.  CONTRAST:  OMNIPAQUE IOHEXOL 350 MG/ML SOLN  COMPARISON:  None.  FINDINGS: CT HEAD  Brain: No acute infarct, hemorrhage, or mass lesion is present. The ventricles are of normal size. There is no significant white matter disease. No significant extraaxial fluid collection is present.  Calvarium and skull base: Within normal limits  Paranasal sinuses: Cleared  Orbits: The globes and orbits are intact.  CTA NECK  Aortic arch: A 3 vessel arch configuration is present. There is no significant stenosis at the  arch.  Right carotid system: The right common carotid artery is within normal limits. The bifurcation is unremarkable. The right internal carotid artery tapers to occlusion 15 mm beyond the bifurcation compatible with a focal dissection the arteries not reconstituted in the neck.  Left carotid system: The left common carotid artery is within normal limits. The bifurcation is unremarkable. The cervical left ICA is normal.  Vertebral arteries:The vertebral arteries both originate from the subclavian arteries. The vertebral arteries are codominant. No focal vascular injury is present in the neck. There is some venous contamination in the neck.  Skeleton: Vertebral body heights and alignment are maintained. No acute fracture or traumatic subluxation is present. There is some reversal of the normal cervical lordosis. This may be positional.  Other neck: The soft tissues the neck are otherwise unremarkable. The lung apices are clear. The thyroid is within normal limits. Salivary glands are intact.  CTA HEAD  Anterior circulation: The right internal carotid artery is reconstituted at the level of  the ophthalmic artery and posterior communicating artery. The terminal right ICA is small. The left internal carotid artery is within normal limits from the high cervical segments through the ICA terminus. The A1 and M1 segments are within normal limits bilaterally. The anterior communicating artery is patent. ACA and MCA branch vessels are intact.  Posterior circulation: The vertebral arteries are codominant. The basilar artery is within normal limits. Both posterior cerebral arteries originate from the basilar tip. Posterior communicating arteries contribute. The PCA branch vessels are within normal limits.  Venous sinuses: The dural sinuses are patent. The transverse sinuses are codominant. Straight sinus and deep cerebral veins are patent. Cortical veins are intact.  Anatomic variants: None  Delayed phase: No pathologic  enhancement is present.  IMPRESSION: 1. 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 2. The left internal carotid artery is reconstituted at the level of the left ophthalmic artery and posterior communicating artery. 3. The A1 and M1 segments are symmetric. 4. No other focal vascular injury or intracranial disease. These results were called by telephone at the time of interpretation on 09/24/2014 at 6:14 pm to Dr. Margarita Grizzle , who verbally acknowledged these results.   Electronically Signed   By: Marin Roberts M.D.   On: 09/24/2014 18:15   I have personally reviewed and evaluated these images and lab results as part of my medical decision-making.   MDM   Final diagnoses:  Internal carotid artery dissection    Discussed results with Dr. Alfredo Batty. Neurology on call paged. Discussed findings with patient. She feels somewhat improved. She continues to have a normal neurological exam. Discussed with Dr. Hosie Poisson on call for hospitalist. Plan aspirin and transferred to hospitalist service at [facility]. Discussed plan patient and family. I answered questions.  They voice understanding of plan. Patient care discussed with Dr. Robb Matar and I will facilitate transfer to [facility] cone. CRITICAL CARE Performed by: Hilario Quarry Total critical care time: 60 Critical care time was exclusive of separately billable procedures and treating other patients. Critical care was necessary to treat or prevent imminent or life-threatening deterioration. Critical care was time spent personally by me on the following activities: development of treatment plan with patient and/or surrogate as well as nursing, discussions with consultants, evaluation of patient's response to treatment, examination of patient, obtaining history from patient or surrogate, ordering and performing treatments and interventions, ordering and review of laboratory studies, ordering and review of  radiographic studies, pulse oximetry and re-evaluation of patient's condition.   Margarita Grizzle, MD 09/24/14 1854  8:03 PM Patient still nauseated and vomiting. Awaiting transfer to [facility].  Additional antiemetics ordered.   Margarita Grizzle, MD 09/24/14 2003

## 2014-09-24 NOTE — Progress Notes (Signed)
[facility] hospitalist progress note. Chief complaint. Transfer note. History of present illness. This 29 year old female presented to [facility] long emergency room with complaints of headache and right upper extremity numbness with nausea and vomiting. CT scan of the head noted a focal left internal carotid artery dissection approximately 1.5 cm above the bifurcation. Findings were discussed with neurology Dr. Hosie Poisson who requested patient be sent to [facility] for further neurologic evaluation. The patient is now arrived in transfer and I am seeing her at bedside to ensure she remains clinically stable and that her orders transferred here appropriately. Patient has no current complaints. Physical exam. Vital signs. Temperature 97.7, pulse 79, respiration 15, blood pressure 140/68. O2 sats 100%. General appearance. Well-developed middle-aged female who is alert and in no distress. Cardiac. Rate and rhythm regular. No bruits or edema. Lungs. Breath sounds clear and equal. Abdomen. Soft with positive bowel sounds. Neurologic. Cranial nerves II-12 grossly intact. No unilateral or focal defects. Impression/plan. Problem #1. Left internal carotid artery dissection. Patient clinically stable at this time. I will text notify neurology of patient's arrival and follow for any neurologic recommendations.  all orders appear to of transferred appropriately.

## 2014-09-24 NOTE — ED Notes (Signed)
Pt c/o headache, intermittent n/v, and intermittent BUE numbness x 5 days.  Pain score 8/10.  Pt reports that she rode an amusement park ride x 5 days ago that "threw her around."  Denies hitting head and LOC.

## 2014-09-25 DIAGNOSIS — I639 Cerebral infarction, unspecified: Secondary | ICD-10-CM | POA: Diagnosis present

## 2014-09-25 DIAGNOSIS — I638 Other cerebral infarction: Secondary | ICD-10-CM

## 2014-09-25 DIAGNOSIS — I7771 Dissection of carotid artery: Secondary | ICD-10-CM | POA: Insufficient documentation

## 2014-09-25 LAB — COMPREHENSIVE METABOLIC PANEL
ALK PHOS: 47 U/L (ref 38–126)
ALT: 12 U/L — AB (ref 14–54)
AST: 13 U/L — AB (ref 15–41)
Albumin: 3.2 g/dL — ABNORMAL LOW (ref 3.5–5.0)
Anion gap: 6 (ref 5–15)
BUN: 7 mg/dL (ref 6–20)
CALCIUM: 8.9 mg/dL (ref 8.9–10.3)
CO2: 27 mmol/L (ref 22–32)
CREATININE: 0.78 mg/dL (ref 0.44–1.00)
Chloride: 106 mmol/L (ref 101–111)
Glucose, Bld: 87 mg/dL (ref 65–99)
Potassium: 3.5 mmol/L (ref 3.5–5.1)
Sodium: 139 mmol/L (ref 135–145)
Total Bilirubin: 0.7 mg/dL (ref 0.3–1.2)
Total Protein: 6.6 g/dL (ref 6.5–8.1)

## 2014-09-25 LAB — LIPID PANEL
CHOLESTEROL: 144 mg/dL (ref 0–200)
HDL: 50 mg/dL (ref >40)
LDL CALC: 84 mg/dL (ref 0–99)
TRIGLYCERIDES: 49 mg/dL (ref <150)
Total CHOL/HDL Ratio: 2.9 RATIO
VLDL: 10 mg/dL (ref 0–40)

## 2014-09-25 LAB — C-REACTIVE PROTEIN: CRP: 0.6 mg/dL (ref <1.0)

## 2014-09-25 LAB — CBC
HEMATOCRIT: 31.6 % — AB (ref 36.0–46.0)
HEMOGLOBIN: 10.1 g/dL — AB (ref 12.0–15.0)
MCH: 26.4 pg (ref 26.0–34.0)
MCHC: 32 g/dL (ref 30.0–36.0)
MCV: 82.5 fL (ref 78.0–100.0)
Platelets: 229 10*3/uL (ref 150–400)
RBC: 3.83 MIL/uL — AB (ref 3.87–5.11)
RDW: 17.4 % — ABNORMAL HIGH (ref 11.5–15.5)
WBC: 8 10*3/uL (ref 4.0–10.5)

## 2014-09-25 LAB — IRON AND TIBC
Iron: 69 ug/dL (ref 28–170)
SATURATION RATIOS: 20 % (ref 10.4–31.8)
TIBC: 351 ug/dL (ref 250–450)
UIBC: 282 ug/dL

## 2014-09-25 LAB — VITAMIN B12: Vitamin B-12: 251 pg/mL (ref 180–914)

## 2014-09-25 LAB — SEDIMENTATION RATE: Sed Rate: 50 mm/hr — ABNORMAL HIGH (ref 0–22)

## 2014-09-25 LAB — GLUCOSE, CAPILLARY: GLUCOSE-CAPILLARY: 77 mg/dL (ref 65–99)

## 2014-09-25 LAB — MRSA PCR SCREENING: MRSA BY PCR: NEGATIVE

## 2014-09-25 LAB — RETICULOCYTES
RBC.: 3.83 MIL/uL — AB (ref 3.87–5.11)
RETIC CT PCT: 1.3 % (ref 0.4–3.1)
Retic Count, Absolute: 49.8 10*3/uL (ref 19.0–186.0)

## 2014-09-25 LAB — TSH: TSH: 1.309 u[IU]/mL (ref 0.350–4.500)

## 2014-09-25 LAB — FERRITIN: FERRITIN: 21 ng/mL (ref 11–307)

## 2014-09-25 LAB — FOLATE: FOLATE: 13 ng/mL (ref >5.9)

## 2014-09-25 MED ORDER — ATORVASTATIN CALCIUM 10 MG PO TABS
20.0000 mg | ORAL_TABLET | Freq: Every day | ORAL | Status: DC
  Administered 2014-09-25: 20 mg via ORAL
  Filled 2014-09-25: qty 2
  Filled 2014-09-25: qty 1

## 2014-09-25 MED ORDER — ACETAMINOPHEN 325 MG PO TABS
650.0000 mg | ORAL_TABLET | Freq: Four times a day (QID) | ORAL | Status: DC | PRN
  Administered 2014-09-25 – 2014-09-27 (×6): 650 mg via ORAL
  Filled 2014-09-25 (×6): qty 2

## 2014-09-25 NOTE — Progress Notes (Signed)
Pt ambulated to the bathroom without assistive device to the bathroom. Gait steady. No noted distress.

## 2014-09-25 NOTE — Progress Notes (Signed)
STROKE TEAM PROGRESS NOTE  HPI Alexandria [name] is an 29 y.o. female who reports that on Monday she rode a roller coaster. Afterward she developed a headache, her hands went numb bilaterally, her hands started to twitch (left greater than right), she was dizzy and off balance. This lasted for a few hours, she went to bed and when she awakened the next day she still had the headache. She massaged her head and vomited. This improved her symptoms but since that day she has had her symptoms intermittently and has continued to have the headache. She has vomited further as well. With no resolution of her symptoms she presented today for evaluation.   SUBJECTIVE (INTERVAL HISTORY) Multiple family members present. The patient states that she feels back to baseline. Dr. Loretha Brasil discussed her condition, the imaging results and recommended she stay on aspirin indefinitely. The plan will be to transfer the patient out of the unit today.   OBJECTIVE Temp:  [97.7 F (36.5 C)-99.2 F (37.3 C)] 98.3 F (36.8 C) (09/11 1149) Pulse Rate:  [29-96] 96 (09/11 1149) Cardiac Rhythm:  [-] Normal sinus rhythm (09/11 0700) Resp:  [12-24] 18 (09/11 1149) BP: (112-174)/(59-103) 115/62 mmHg (09/11 1149) SpO2:  [90 %-100 %] 99 % (09/11 1149) Weight:  [85.5 kg (188 lb 7.9 oz)] 85.5 kg (188 lb 7.9 oz) (09/10 2140)  CBC:  Recent Labs Lab 09/24/14 2234 09/25/14 0413  WBC 8.6 8.0  HGB 10.5* 10.1*  HCT 32.0* 31.6*  MCV 82.5 82.5  PLT 245 229    Basic Metabolic Panel:  Recent Labs Lab 09/24/14 1553 09/24/14 2234 09/25/14 0413  NA 141  --  139  K 3.5  --  3.5  CL 108  --  106  CO2 27  --  27  GLUCOSE 102*  --  87  BUN 12  --  7  CREATININE 0.74 0.69 0.78  CALCIUM 9.7  --  8.9    Lipid Panel:    Component Value Date/Time   CHOL 144 09/25/2014 0413   TRIG 49 09/25/2014 0413   HDL 50 09/25/2014 0413   CHOLHDL 2.9 09/25/2014 0413   VLDL 10 09/25/2014 0413   LDLCALC 84 09/25/2014 0413   HgbA1c:  No results found for: HGBA1C Urine Drug Screen: No results found for: LABOPIA, COCAINSCRNUR, LABBENZ, AMPHETMU, THCU, LABBARB    IMAGING  Ct Angio Head and Neck W/cm &/or Wo Cm 09/24/2014    1. 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 2. The left internal carotid artery is reconstituted at the level of the left ophthalmic artery and posterior communicating artery.  3. The A1 and M1 segments are symmetric.  4. No other focal vascular injury or intracranial disease.    Mr Brain Wo Contrast 09/25/2014    1. Patchy multi focal acute right MCA territory infarcts involving the posterior right frontal and right parietal lobes. There is small amount of associated petechial hemorrhage without hemorrhagic transformation.  2. Additional tiny 4 mm acute ischemic infarct within the body of the right caudate nucleus.  3. Abnormal flow void within the petrous and proximal cavernous right ICA, consistent with known right ICA dissection.      Dg Chest Port 1 View 09/25/2014    No active disease.      PHYSICAL EXAM Mental Status: Alert, oriented, thought content appropriate. Speech fluent without evidence of aphasia. Able to follow 3 step commands without difficulty. Cranial Nerves: II: Visual fields grossly normal, pupils  equal, round, reactive to light and accommodation III,IV, VI: ptosis not present, extra-ocular motions intact bilaterally V,VII: smile symmetric, facial light touch sensation normal bilaterally VIII: hearing normal bilaterally IX,X: gag reflex present XI: bilateral shoulder shrug XII: midline tongue extension Motor: Right :Upper extremity 5/5Left: Upper extremity 5/5 Lower extremity 5/5Lower extremity 5/5 Tone and bulk:normal tone throughout; no atrophy noted Sensory: light touch intact throughout,  bilaterally Cerebellar: normal finger-to-nose Gait: Deferred at this time    ASSESSMENT/PLAN [name] is a 29 y.o. female with no significant past medical history presenting with a headache, right upper extremity numbness, nausea and vomiting. She did not receive IV t-PA due to late presentation.  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  Resultant  resolution deficits  MRI  Patchy multi focal acute right MCA territory infarcts involving the posterior right frontal and right parietal lobes. Additional tiny 4 mm acute ischemic infarct within the body of the right caudate nucleus.  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  2D Echo  pending  LDL 84  HgbA1c pending  VTE prophylaxis - Lovenox  Diet regular Room service appropriate?: Yes; Fluid consistency:: Thin  no antithrombotic prior to admission, now on aspirin 325 mg orally every day  Patient counseled to be compliant with her antithrombotic medications  Ongoing aggressive stroke risk factor management  Therapy recommendations: Pending  Disposition:  Pending  Hypertension  Stable Permissive hypertension (OK if < 220/120) but gradually normalize in 5-7 days  Hyperlipidemia  Home meds:  No lipid lowering medications prior to admission  LDL 84, goal < 70  Add Lipitor 20 mg daily  Continue statin at discharge    Other Stroke Risk Factors  ETOH use  Obesity, Body mass index is 29.52 kg/(m^2).    Other Active Problems  Anemia - to be worked up  Hospital day # 1  Delton See PA-C [facility] Pager [phone] 09/25/2014, 1:01 PM      To contact Stroke Continuity provider, please refer to [email or web address]. After hours, contact General Neurology

## 2014-09-25 NOTE — Evaluation (Signed)
Physical Therapy Evaluation Patient Details Name: Alexandria [name] MRN: [id number] DOB: 1985-09-11 Today's Date: 09/25/2014   History of Present Illness  Pt adm with lt internal carotid dissection after riding amusement ride. 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  Clinical Impression  Pt doing well with mobility and no further PT needed.  Ready for dc from PT standpoint. Pt with good balance. Only reported slight dizziness with 360 degree turn and with picking up object off of floor. Instructed pt to be cautious about sudden movements because they might cause dizziness. Instructed pt to ask MD about return to work and more rigorous activity (pt was getting ready to start working out with personal trainer but has delayed the start of that.)      Follow Up Recommendations No PT follow up    Equipment Recommendations  None recommended by PT    Recommendations for Other Services       Precautions / Restrictions Precautions Precautions: None Restrictions Weight Bearing Restrictions: No      Mobility  Bed Mobility Overal bed mobility: Independent                Transfers Overall transfer level: Independent                  Ambulation/Gait Ambulation/Gait assistance: Modified independent (Device/Increase time) Ambulation Distance (Feet): 400 Feet Assistive device: None Gait Pattern/deviations: WFL(Within Functional Limits)     General Gait Details: Pt's gait slightly guarded.  Stairs            Wheelchair Mobility    Modified Rankin (Stroke Patients Only) Modified Rankin (Stroke Patients Only) Pre-Morbid Rankin Score: No symptoms Modified Rankin: No significant disability     Balance Overall balance assessment: Independent (tandem stance, feet together stance, single leg stance  WNL)                                           Pertinent Vitals/Pain Pain Assessment: No/denies pain    Home Living Family/patient expects to be discharged to:: Private residence Living Arrangements: Parent Available Help at Discharge: Family                  Prior Function Level of Independence: Independent         Comments: Works in HR at [employer]     Higher education careers adviser        Extremity/Trunk Assessment   Upper Extremity Assessment: Defer to OT evaluation           Lower Extremity Assessment: Overall WFL for tasks assessed         Communication   Communication: No difficulties  Cognition Arousal/Alertness: Awake/alert Behavior During Therapy: WFL for tasks assessed/performed Overall Cognitive Status: Within Functional Limits for tasks assessed                      General Comments      Exercises        Assessment/Plan    PT Assessment Patent does not need any further PT services  PT Diagnosis Difficulty walking   PT Problem List    PT Treatment Interventions     PT Goals (Current goals can be found in the Care  Plan section) Acute Rehab PT Goals PT Goal Formulation: All assessment and education complete, DC therapy    Frequency     Barriers to discharge        Co-evaluation               End of Session   Activity Tolerance: Patient tolerated treatment well Patient left: in bed;with call bell/phone within reach;with bed alarm set           Time: 1610-9604 PT Time Calculation (min) (ACUTE ONLY): 13 min   Charges:   PT Evaluation $Initial PT Evaluation Tier I: 1 Procedure     PT G Codes:        Sherrilyn Nairn 10-15-14, 4:37 PM  [facility] PT [phone]

## 2014-09-25 NOTE — Progress Notes (Signed)
Pt received at this time alert, verbal with no noted distress. Pt stable, neuro intact. Pt oriented to room. Safety measures in place. Call bell within reach. Pt placed on telemetry. Will continue to monitor.

## 2014-09-25 NOTE — Progress Notes (Signed)
[facility] PROGRESS NOTE  Alexandria [name] RUE:[id number] DOB: April 04, 1985 DOA: 09/24/2014 PCP: No primary care provider on file.  Assessment/Plan: 1. 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 -Appreciate Neuro consult -Continue ASA -check LDL, hbaic, neuro checks -no neuro deficits at this time -no role for Vascular evaluation at this time, d/w Neuro MD  2.Headache  -due to 1 -tylenol PRN  DVt proph: lovenox  Code Status: [place] Family Communication: none at bedside Disposition Plan: Tx to tele  Consultants: Neuro  HPI/Subjective: Feels ok, mild intermittent headache  Objective: Filed Vitals:   09/25/14 1149  BP: 115/62  Pulse: 96  Temp: 98.3 F (36.8 C)  Resp: 18    Intake/Output Summary (Last 24 hours) at 09/25/14 1255 Last data filed at 09/25/14 0700  Gross per 24 hour  Intake 1263.75 ml  Output   1200 ml  Net  63.75 ml   Filed Weights   09/24/14 2140  Weight: 85.5 kg (188 lb 7.9 oz)    Exam:   General:  AAOx3, no distress  Cardiovascular: S1S2/RRR  Respiratory: CTAB  Abdomen: soft, NT, BS present  Musculoskeletal: no edema c/c  Data Reviewed: Basic Metabolic Panel:  Recent Labs Lab 09/24/14 1553 09/24/14 2234 09/25/14 0413  NA 141  --  139  K 3.5  --  3.5  CL 108  --  106  CO2 27  --  27  GLUCOSE 102*  --  87  BUN 12  --  7  CREATININE 0.74 0.69 0.78  CALCIUM 9.7  --  8.9   Liver Function Tests:  Recent Labs Lab 09/24/14 1553 09/25/14 0413  AST 15 13*  ALT 15 12*  ALKPHOS 56 47  BILITOT 0.5 0.7  PROT 8.5* 6.6  ALBUMIN 4.3 3.2*   No results for input(s): LIPASE, AMYLASE in the last 168 hours. No results for input(s): AMMONIA in the last 168 hours. CBC:  Recent Labs Lab 09/24/14 1553 09/24/14 2234 09/25/14 0413  WBC 9.2 8.6 8.0  HGB 11.1* 10.5* 10.1*  HCT 35.2* 32.0* 31.6*  MCV 83.4 82.5 82.5  PLT 255 245 229   Cardiac Enzymes: No results for input(s): CKTOTAL, CKMB, CKMBINDEX, TROPONINI in the last 168  hours. BNP (last 3 results) No results for input(s): BNP in the last 8760 hours.  ProBNP (last 3 results) No results for input(s): PROBNP in the last 8760 hours.  CBG:  Recent Labs Lab 09/25/14 0824  GLUCAP 77    Recent Results (from the past 240 hour(s))  MRSA PCR Screening     Status: None   Collection Time: 09/24/14  9:30 PM  Result Value Ref Range Status   MRSA by PCR NEGATIVE NEGATIVE Final    Comment:        The GeneXpert MRSA Assay (FDA approved for NASAL specimens only), is one component of a comprehensive MRSA colonization surveillance program. It is not intended to diagnose MRSA infection nor to guide or monitor treatment for MRSA infections.      Studies: Ct Angio Head W/cm &/or Wo Cm  09/24/2014   CLINICAL DATA:  Headache following roller coaster ride. The patient has had headache, intermittent nausea vomiting, and intermittent bilateral upper extremity numbness for the past 5 days following the ride.  EXAM: CT ANGIOGRAPHY HEAD AND NECK  TECHNIQUE: Multidetector CT imaging of the head and neck was performed using the standard protocol during bolus administration of intravenous contrast. Multiplanar CT image reconstructions and MIPs were obtained to evaluate the vascular anatomy.  Carotid stenosis measurements (when applicable) are obtained utilizing NASCET criteria, using the distal internal carotid diameter as the denominator.  CONTRAST:  OMNIPAQUE IOHEXOL 350 MG/ML SOLN  COMPARISON:  None.  FINDINGS: CT HEAD  Brain: No acute infarct, hemorrhage, or mass lesion is present. The ventricles are of normal size. There is no significant white matter disease. No significant extraaxial fluid collection is present.  Calvarium and skull base: Within normal limits  Paranasal sinuses: Cleared  Orbits: The globes and orbits are intact.  CTA NECK  Aortic arch: A 3 vessel arch configuration is present. There is no significant stenosis at the arch.  Right carotid system: The  right common carotid artery is within normal limits. The bifurcation is unremarkable. The right internal carotid artery tapers to occlusion 15 mm beyond the bifurcation compatible with a focal dissection the arteries not reconstituted in the neck.  Left carotid system: The left common carotid artery is within normal limits. The bifurcation is unremarkable. The cervical left ICA is normal.  Vertebral arteries:The vertebral arteries both originate from the subclavian arteries. The vertebral arteries are codominant. No focal vascular injury is present in the neck. There is some venous contamination in the neck.  Skeleton: Vertebral body heights and alignment are maintained. No acute fracture or traumatic subluxation is present. There is some reversal of the normal cervical lordosis. This may be positional.  Other neck: The soft tissues the neck are otherwise unremarkable. The lung apices are clear. The thyroid is within normal limits. Salivary glands are intact.  CTA HEAD  Anterior circulation: The right internal carotid artery is reconstituted at the level of the ophthalmic artery and posterior communicating artery. The terminal right ICA is small. The left internal carotid artery is within normal limits from the high cervical segments through the ICA terminus. The A1 and M1 segments are within normal limits bilaterally. The anterior communicating artery is patent. ACA and MCA branch vessels are intact.  Posterior circulation: The vertebral arteries are codominant. The basilar artery is within normal limits. Both posterior cerebral arteries originate from the basilar tip. Posterior communicating arteries contribute. The PCA branch vessels are within normal limits.  Venous sinuses: The dural sinuses are patent. The transverse sinuses are codominant. Straight sinus and deep cerebral veins are patent. Cortical veins are intact.  Anatomic variants: None  Delayed phase: No pathologic enhancement is present.  IMPRESSION: 1.  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 2. The left internal carotid artery is reconstituted at the level of the left ophthalmic artery and posterior communicating artery. 3. The A1 and M1 segments are symmetric. 4. No other focal vascular injury or intracranial disease. These results were called by telephone at the time of interpretation on 09/24/2014 at 6:14 pm to Dr. Margarita Grizzle , who verbally acknowledged these results.   Electronically Signed   By: Marin Roberts M.D.   On: 09/24/2014 18:15   Ct Angio Neck W/cm &/or Wo/cm  09/24/2014   CLINICAL DATA:  Headache following roller coaster ride. The patient has had headache, intermittent nausea vomiting, and intermittent bilateral upper extremity numbness for the past 5 days following the ride.  EXAM: CT ANGIOGRAPHY HEAD AND NECK  TECHNIQUE: Multidetector CT imaging of the head and neck was performed using the standard protocol during bolus administration of intravenous contrast. Multiplanar CT image reconstructions and MIPs were obtained to evaluate the vascular anatomy. Carotid stenosis measurements (when applicable) are obtained utilizing NASCET criteria, using the distal  internal carotid diameter as the denominator.  CONTRAST:  OMNIPAQUE IOHEXOL 350 MG/ML SOLN  COMPARISON:  None.  FINDINGS: CT HEAD  Brain: No acute infarct, hemorrhage, or mass lesion is present. The ventricles are of normal size. There is no significant white matter disease. No significant extraaxial fluid collection is present.  Calvarium and skull base: Within normal limits  Paranasal sinuses: Cleared  Orbits: The globes and orbits are intact.  CTA NECK  Aortic arch: A 3 vessel arch configuration is present. There is no significant stenosis at the arch.  Right carotid system: The right common carotid artery is within normal limits. The bifurcation is unremarkable. The right internal carotid artery tapers to occlusion 15  mm beyond the bifurcation compatible with a focal dissection the arteries not reconstituted in the neck.  Left carotid system: The left common carotid artery is within normal limits. The bifurcation is unremarkable. The cervical left ICA is normal.  Vertebral arteries:The vertebral arteries both originate from the subclavian arteries. The vertebral arteries are codominant. No focal vascular injury is present in the neck. There is some venous contamination in the neck.  Skeleton: Vertebral body heights and alignment are maintained. No acute fracture or traumatic subluxation is present. There is some reversal of the normal cervical lordosis. This may be positional.  Other neck: The soft tissues the neck are otherwise unremarkable. The lung apices are clear. The thyroid is within normal limits. Salivary glands are intact.  CTA HEAD  Anterior circulation: The right internal carotid artery is reconstituted at the level of the ophthalmic artery and posterior communicating artery. The terminal right ICA is small. The left internal carotid artery is within normal limits from the high cervical segments through the ICA terminus. The A1 and M1 segments are within normal limits bilaterally. The anterior communicating artery is patent. ACA and MCA branch vessels are intact.  Posterior circulation: The vertebral arteries are codominant. The basilar artery is within normal limits. Both posterior cerebral arteries originate from the basilar tip. Posterior communicating arteries contribute. The PCA branch vessels are within normal limits.  Venous sinuses: The dural sinuses are patent. The transverse sinuses are codominant. Straight sinus and deep cerebral veins are patent. Cortical veins are intact.  Anatomic variants: None  Delayed phase: No pathologic enhancement is present.  IMPRESSION: 1. 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 2. The left internal carotid artery is  reconstituted at the level of the left ophthalmic artery and posterior communicating artery. 3. The A1 and M1 segments are symmetric. 4. No other focal vascular injury or intracranial disease. These results were called by telephone at the time of interpretation on 09/24/2014 at 6:14 pm to Dr. Margarita Grizzle , who verbally acknowledged these results.   Electronically Signed   By: Marin Roberts M.D.   On: 09/24/2014 18:15   Mr Brain Wo Contrast  09/25/2014   CLINICAL DATA:  Initial evaluation for acute headache, hand numbness, dizziness, found have right ICA dissection.  EXAM: MRI HEAD WITHOUT CONTRAST  TECHNIQUE: Multiplanar, multiecho pulse sequences of the brain and surrounding structures were obtained without intravenous contrast.  COMPARISON:  Prior study from earlier the same day.  FINDINGS: Cerebral volume within normal limits the patient age. No significant white matter disease.  Abnormal patchy restricted diffusion involving the cortical gray matter and underlying white matter of the posterior right frontal and parietal lobes, compatible with acute ischemic infarct. No significant mass effect. There is minimal petechial hemorrhage  within the posterior right frontal region without hemorrhagic transformation (series 8, image 16). Additional 4 mm ischemic infarct within the body of the right caudate (series 4, image 32). No other infarct. Abnormal flow void within the petrous and proximal cavernous right ICA noted, compatible with known right ICA dissection. Intracranial flow voids otherwise maintained.  No mass lesion, midline shift, or mass effect. No hydrocephalus. No extra-axial fluid collection.  Craniocervical junction within normal limits. Visualized upper cervical spine is unremarkable. Pituitary gland normal.  No acute abnormality about the orbits.  Paranasal sinuses are clear. No mastoid effusion. Inner ear structures normal.  Bone marrow signal intensity normal. No scalp soft tissue  abnormality.  IMPRESSION: 1. Patchy multi focal acute right MCA territory infarcts involving the posterior right frontal and right parietal lobes. There is small amount of associated petechial hemorrhage without hemorrhagic transformation. 2. Additional tiny 4 mm acute ischemic infarct within the body of the right caudate nucleus. 3. Abnormal flow void within the petrous and proximal cavernous right ICA, consistent with known right ICA dissection.   Electronically Signed   By: Rise Mu M.D.   On: 09/25/2014 00:02   Dg Chest Port 1 View  09/25/2014   CLINICAL DATA:  29 year old female with carotid artery dissection and shortness of breath.  EXAM: PORTABLE CHEST - 1 VIEW  COMPARISON:  None.  FINDINGS: The heart size and mediastinal contours are within normal limits. Both lungs are clear. The visualized skeletal structures are unremarkable.  IMPRESSION: No active disease.   Electronically Signed   By: Elgie Collard M.D.   On: 09/25/2014 02:03    Scheduled Meds: .  stroke: mapping our early stages of recovery book   Does not apply Once  . aspirin EC  325 mg Oral Daily  . enoxaparin (LOVENOX) injection  40 mg Subcutaneous Q24H  . sodium chloride  3 mL Intravenous Q12H   Continuous Infusions:  Antibiotics Given (last 72 hours)    None      Principal Problem:   Carotid artery dissection Active Problems:   Anemia   Nausea and vomiting   CVA (cerebral infarction)    Time spent:    [facility]  [facility] Pager [phone]. If 7PM-7AM, please contact night-coverage at [email or web address], password [facility] 09/25/2014, 12:55 PM  LOS: 1 day

## 2014-09-26 ENCOUNTER — Inpatient Hospital Stay (HOSPITAL_COMMUNITY): Payer: Commercial Managed Care - HMO

## 2014-09-26 DIAGNOSIS — I639 Cerebral infarction, unspecified: Secondary | ICD-10-CM

## 2014-09-26 DIAGNOSIS — I6789 Other cerebrovascular disease: Secondary | ICD-10-CM

## 2014-09-26 LAB — BASIC METABOLIC PANEL
Anion gap: 7 (ref 5–15)
BUN: 13 mg/dL (ref 6–20)
CHLORIDE: 102 mmol/L (ref 101–111)
CO2: 28 mmol/L (ref 22–32)
Calcium: 8.8 mg/dL — ABNORMAL LOW (ref 8.9–10.3)
Glucose, Bld: 104 mg/dL — ABNORMAL HIGH (ref 65–99)
Potassium: 3.4 mmol/L — ABNORMAL LOW (ref 3.5–5.1)
SODIUM: 137 mmol/L (ref 135–145)

## 2014-09-26 LAB — CBC
HCT: 33.6 % — ABNORMAL LOW (ref 36.0–46.0)
Hemoglobin: 10.7 g/dL — ABNORMAL LOW (ref 12.0–15.0)
MCH: 26.6 pg (ref 26.0–34.0)
MCHC: 31.8 g/dL (ref 30.0–36.0)
MCV: 83.4 fL (ref 78.0–100.0)
PLATELETS: 246 10*3/uL (ref 150–400)
RBC: 4.03 MIL/uL (ref 3.87–5.11)
RDW: 17.4 % — AB (ref 11.5–15.5)
WBC: 6.3 10*3/uL (ref 4.0–10.5)

## 2014-09-26 LAB — ANTINUCLEAR ANTIBODIES, IFA: ANA Ab, IFA: NEGATIVE

## 2014-09-26 LAB — HEMOGLOBIN A1C
Hgb A1c MFr Bld: 5.7 % — ABNORMAL HIGH (ref 4.8–5.6)
Mean Plasma Glucose: 117 mg/dL

## 2014-09-26 MED ORDER — ATORVASTATIN CALCIUM 10 MG PO TABS: 10.0000 mg | ORAL_TABLET | Freq: Every day | ORAL | Status: DC

## 2014-09-26 MED ORDER — UNABLE TO FIND: Status: DC

## 2014-09-26 MED ORDER — ACETAMINOPHEN 325 MG PO TABS: 650.0000 mg | ORAL_TABLET | Freq: Four times a day (QID) | ORAL | Status: DC | PRN

## 2014-09-26 MED ORDER — ASPIRIN 325 MG PO TBEC: 325.0000 mg | DELAYED_RELEASE_TABLET | Freq: Every day | ORAL | Status: DC

## 2014-09-26 MED ORDER — ATORVASTATIN CALCIUM 10 MG PO TABS
10.0000 mg | ORAL_TABLET | Freq: Every day | ORAL | Status: DC
  Administered 2014-09-26: 10 mg via ORAL
  Filled 2014-09-26: qty 1

## 2014-09-26 NOTE — Discharge Summary (Signed)
Physician Discharge Summary  Alexandria [name] ZOX:[id number] DOB: [birth year] DOA: 09/24/2014  PCP: No primary care provider on file.  Admit date: 09/24/2014 Discharge date: 09/26/2014  Time spent: 45 minutes  Recommendations for Outpatient Follow-up:  Dr.Xu in 2 months, needs repeat CTA neck then  Discharge Diagnoses:  Principal Problem:   Carotid artery dissection Active Problems:   Anemia   Nausea and vomiting   CVA (cerebral infarction)   Internal carotid artery dissection   Discharge Condition: stable  Diet recommendation: heart healthy  Filed Weights   09/24/14 2140  Weight: 85.5 kg (188 lb 7.9 oz)    History of present illness:  Chief Complaint: Headache and numbness.  HPI: Alexandria [name] is a 29 y.o. female with no past medical history who comes to the hospital due to headache, right upper extremity numbness, nausea and vomiting. Per patient, on Monday she had a ride at an amusement park which had the passengers rapidly moving up and down and side words during the actual ride and she states that during this time she developed a left-sided and occipital headache associated with nausea. She also complains of having numbness on both of her upper extremities. She has some nausea and emesis the following days, but her headache seems to have improved during the week. However 9/10 patient was outside and noticed the her headache recurred and he was more intense than he had been earlier.  Hospital Course:   R MCA infarct secondary to R ICA dissection in setting of recent roller coaster ride (suspicious etiology of dissection)  Resultant resolution deficits, mild delay in though processing  MRI Patchy multi focal acute right MCA territory infarcts involving the posterior right frontal and right parietal lobes. Additional tiny 4 mm acute ischemic infarct within the body of the right caudate nucleus.  MRA not performed  2D Echo with normal EF and wall motion  LDL 84,  started on low dose statin  HgbA1c 5.7  Outpatient Ot recommended  Follow-up Stroke Clinic at [facility] with Dr. Marvel Plan in 2 months, needs repeat CTA neck  Hypertension  Stable  Hyperlipidemia  Home meds: No lipid lowering medications prior to admission  LDL 84, goal < 70, started on low dose lipitor at discharge  Mild normocytic anemia -defer workup to PCP       Consultations: Neurology  Discharge Exam: Filed Vitals:   09/26/14 1009  BP: 130/78  Pulse:   Temp:   Resp: 20    General: AAOx3 Cardiovascular: S1S2/RRR Respiratory: CTAB  Discharge Instructions   Discharge Instructions    Ambulatory referral to Neurology    Complete by:  As directed   Dr. Roda Shutters requests followup in 2 months     Diet - low sodium heart healthy    Complete by:  As directed      Increase activity slowly    Complete by:  As directed           Current Discharge Medication List    START taking these medications   Details  acetaminophen (TYLENOL) 325 MG tablet Take 2 tablets (650 mg total) by mouth every 6 (six) hours as needed for moderate pain, fever or headache.    aspirin EC 325 MG EC tablet Take 1 tablet (325 mg total) by mouth daily. Qty: 30 tablet, Refills: 0    atorvastatin (LIPITOR) 10 MG tablet Take 1 tablet (10 mg total) by mouth daily at 6 PM. Qty: 30 tablet, Refills: 0    UNABLE TO  FIND This note is to excuse Ms.Katayama from work due to medical illness requiring hospitalization Qty: 1 each, Refills: 0      CONTINUE these medications which have NOT CHANGED   Details  Multiple Vitamin (MULTIVITAMIN WITH MINERALS) TABS tablet Take 1 tablet by mouth daily.      STOP taking these medications     DM-Doxylamine-Acetaminophen (NYQUIL COLD & FLU PO)        Allergies  Allergen Reactions  . Fish-Derived Products Nausea Only    Patient stated she gets very nauseated just smelling seafood.   Follow-up Information    Follow up with  Xu,Jindong, MD In 2 months.   Specialty:  Neurology   Why:  Stroke Clinic, Office will call you with appointment date & time   Contact information:   [address] [place] [id number] [phone]        The results of significant diagnostics from this hospitalization (including imaging, microbiology, ancillary and laboratory) are listed below for reference.    Significant Diagnostic Studies: Ct Angio Head W/cm &/or Wo Cm  09/24/2014   CLINICAL DATA:  Headache following roller coaster ride. The patient has had headache, intermittent nausea vomiting, and intermittent bilateral upper extremity numbness for the past 5 days following the ride.  EXAM: CT ANGIOGRAPHY HEAD AND NECK  TECHNIQUE: Multidetector CT imaging of the head and neck was performed using the standard protocol during bolus administration of intravenous contrast. Multiplanar CT image reconstructions and MIPs were obtained to evaluate the vascular anatomy. Carotid stenosis measurements (when applicable) are obtained utilizing NASCET criteria, using the distal internal carotid diameter as the denominator.  CONTRAST:  OMNIPAQUE IOHEXOL 350 MG/ML SOLN  COMPARISON:  None.  FINDINGS: CT HEAD  Brain: No acute infarct, hemorrhage, or mass lesion is present. The ventricles are of normal size. There is no significant white matter disease. No significant extraaxial fluid collection is present.  Calvarium and skull base: Within normal limits  Paranasal sinuses: Cleared  Orbits: The globes and orbits are intact.  CTA NECK  Aortic arch: A 3 vessel arch configuration is present. There is no significant stenosis at the arch.  Right carotid system: The right common carotid artery is within normal limits. The bifurcation is unremarkable. The right internal carotid artery tapers to occlusion 15 mm beyond the bifurcation compatible with a focal dissection the arteries not reconstituted in the neck.  Left carotid system: The left common  carotid artery is within normal limits. The bifurcation is unremarkable. The cervical left ICA is normal.  Vertebral arteries:The vertebral arteries both originate from the subclavian arteries. The vertebral arteries are codominant. No focal vascular injury is present in the neck. There is some venous contamination in the neck.  Skeleton: Vertebral body heights and alignment are maintained. No acute fracture or traumatic subluxation is present. There is some reversal of the normal cervical lordosis. This may be positional.  Other neck: The soft tissues the neck are otherwise unremarkable. The lung apices are clear. The thyroid is within normal limits. Salivary glands are intact.  CTA HEAD  Anterior circulation: The right internal carotid artery is reconstituted at the level of the ophthalmic artery and posterior communicating artery. The terminal right ICA is small. The left internal carotid artery is within normal limits from the high cervical segments through the ICA terminus. The A1 and M1 segments are within normal limits bilaterally. The anterior communicating artery is patent. ACA and MCA branch vessels are intact.  Posterior circulation:  The vertebral arteries are codominant. The basilar artery is within normal limits. Both posterior cerebral arteries originate from the basilar tip. Posterior communicating arteries contribute. The PCA branch vessels are within normal limits.  Venous sinuses: The dural sinuses are patent. The transverse sinuses are codominant. Straight sinus and deep cerebral veins are patent. Cortical veins are intact.  Anatomic variants: None  Delayed phase: No pathologic enhancement is present.  IMPRESSION: 1. 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 2. The left internal carotid artery is reconstituted at the level of the left ophthalmic artery and posterior communicating artery. 3. The A1 and M1 segments are symmetric. 4. No other  focal vascular injury or intracranial disease. These results were called by telephone at the time of interpretation on 09/24/2014 at 6:14 pm to Dr. Margarita Grizzle , who verbally acknowledged these results.   Electronically Signed   By: Marin Roberts M.D.   On: 09/24/2014 18:15   Ct Angio Neck W/cm &/or Wo/cm  09/24/2014   CLINICAL DATA:  Headache following roller coaster ride. The patient has had headache, intermittent nausea vomiting, and intermittent bilateral upper extremity numbness for the past 5 days following the ride.  EXAM: CT ANGIOGRAPHY HEAD AND NECK  TECHNIQUE: Multidetector CT imaging of the head and neck was performed using the standard protocol during bolus administration of intravenous contrast. Multiplanar CT image reconstructions and MIPs were obtained to evaluate the vascular anatomy. Carotid stenosis measurements (when applicable) are obtained utilizing NASCET criteria, using the distal internal carotid diameter as the denominator.  CONTRAST:  OMNIPAQUE IOHEXOL 350 MG/ML SOLN  COMPARISON:  None.  FINDINGS: CT HEAD  Brain: No acute infarct, hemorrhage, or mass lesion is present. The ventricles are of normal size. There is no significant white matter disease. No significant extraaxial fluid collection is present.  Calvarium and skull base: Within normal limits  Paranasal sinuses: Cleared  Orbits: The globes and orbits are intact.  CTA NECK  Aortic arch: A 3 vessel arch configuration is present. There is no significant stenosis at the arch.  Right carotid system: The right common carotid artery is within normal limits. The bifurcation is unremarkable. The right internal carotid artery tapers to occlusion 15 mm beyond the bifurcation compatible with a focal dissection the arteries not reconstituted in the neck.  Left carotid system: The left common carotid artery is within normal limits. The bifurcation is unremarkable. The cervical left ICA is normal.  Vertebral arteries:The vertebral  arteries both originate from the subclavian arteries. The vertebral arteries are codominant. No focal vascular injury is present in the neck. There is some venous contamination in the neck.  Skeleton: Vertebral body heights and alignment are maintained. No acute fracture or traumatic subluxation is present. There is some reversal of the normal cervical lordosis. This may be positional.  Other neck: The soft tissues the neck are otherwise unremarkable. The lung apices are clear. The thyroid is within normal limits. Salivary glands are intact.  CTA HEAD  Anterior circulation: The right internal carotid artery is reconstituted at the level of the ophthalmic artery and posterior communicating artery. The terminal right ICA is small. The left internal carotid artery is within normal limits from the high cervical segments through the ICA terminus. The A1 and M1 segments are within normal limits bilaterally. The anterior communicating artery is patent. ACA and MCA branch vessels are intact.  Posterior circulation: The vertebral arteries are codominant. The basilar artery is within normal limits. Both  posterior cerebral arteries originate from the basilar tip. Posterior communicating arteries contribute. The PCA branch vessels are within normal limits.  Venous sinuses: The dural sinuses are patent. The transverse sinuses are codominant. Straight sinus and deep cerebral veins are patent. Cortical veins are intact.  Anatomic variants: None  Delayed phase: No pathologic enhancement is present.  IMPRESSION: 1. 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 2. The left internal carotid artery is reconstituted at the level of the left ophthalmic artery and posterior communicating artery. 3. The A1 and M1 segments are symmetric. 4. No other focal vascular injury or intracranial disease. These results were called by telephone at the time of interpretation on 09/24/2014 at 6:14 pm to  Dr. Margarita Grizzle , who verbally acknowledged these results.   Electronically Signed   By: Marin Roberts M.D.   On: 09/24/2014 18:15   Mr Brain Wo Contrast  09/25/2014   CLINICAL DATA:  Initial evaluation for acute headache, hand numbness, dizziness, found have right ICA dissection.  EXAM: MRI HEAD WITHOUT CONTRAST  TECHNIQUE: Multiplanar, multiecho pulse sequences of the brain and surrounding structures were obtained without intravenous contrast.  COMPARISON:  Prior study from earlier the same day.  FINDINGS: Cerebral volume within normal limits the patient age. No significant white matter disease.  Abnormal patchy restricted diffusion involving the cortical gray matter and underlying white matter of the posterior right frontal and parietal lobes, compatible with acute ischemic infarct. No significant mass effect. There is minimal petechial hemorrhage within the posterior right frontal region without hemorrhagic transformation (series 8, image 16). Additional 4 mm ischemic infarct within the body of the right caudate (series 4, image 32). No other infarct. Abnormal flow void within the petrous and proximal cavernous right ICA noted, compatible with known right ICA dissection. Intracranial flow voids otherwise maintained.  No mass lesion, midline shift, or mass effect. No hydrocephalus. No extra-axial fluid collection.  Craniocervical junction within normal limits. Visualized upper cervical spine is unremarkable. Pituitary gland normal.  No acute abnormality about the orbits.  Paranasal sinuses are clear. No mastoid effusion. Inner ear structures normal.  Bone marrow signal intensity normal. No scalp soft tissue abnormality.  IMPRESSION: 1. Patchy multi focal acute right MCA territory infarcts involving the posterior right frontal and right parietal lobes. There is small amount of associated petechial hemorrhage without hemorrhagic transformation. 2. Additional tiny 4 mm acute ischemic infarct within the  body of the right caudate nucleus. 3. Abnormal flow void within the petrous and proximal cavernous right ICA, consistent with known right ICA dissection.   Electronically Signed   By: Rise Mu M.D.   On: 09/25/2014 00:02   Dg Chest Port 1 View  09/25/2014   CLINICAL DATA:  29 year old female with carotid artery dissection and shortness of breath.  EXAM: PORTABLE CHEST - 1 VIEW  COMPARISON:  None.  FINDINGS: The heart size and mediastinal contours are within normal limits. Both lungs are clear. The visualized skeletal structures are unremarkable.  IMPRESSION: No active disease.   Electronically Signed   By: Elgie Collard M.D.   On: 09/25/2014 02:03    Microbiology: Recent Results (from the past 240 hour(s))  MRSA PCR Screening     Status: None   Collection Time: 09/24/14  9:30 PM  Result Value Ref Range Status   MRSA by PCR NEGATIVE NEGATIVE Final    Comment:        The GeneXpert MRSA Assay (FDA approved for NASAL  specimens only), is one component of a comprehensive MRSA colonization surveillance program. It is not intended to diagnose MRSA infection nor to guide or monitor treatment for MRSA infections.      Labs: Basic Metabolic Panel:  Recent Labs Lab 09/24/14 1553 09/24/14 2234 09/25/14 0413 09/26/14 0531  NA 141  --  139 137  K 3.5  --  3.5 3.4*  CL 108  --  106 102  CO2 27  --  27 28  GLUCOSE 102*  --  87 104*  BUN 12  --  7 13  CREATININE 0.74 0.69 0.78 <0.30*  CALCIUM 9.7  --  8.9 8.8*   Liver Function Tests:  Recent Labs Lab 09/24/14 1553 09/25/14 0413  AST 15 13*  ALT 15 12*  ALKPHOS 56 47  BILITOT 0.5 0.7  PROT 8.5* 6.6  ALBUMIN 4.3 3.2*   No results for input(s): LIPASE, AMYLASE in the last 168 hours. No results for input(s): AMMONIA in the last 168 hours. CBC:  Recent Labs Lab 09/24/14 1553 09/24/14 2234 09/25/14 0413 09/26/14 0531  WBC 9.2 8.6 8.0 6.3  HGB 11.1* 10.5* 10.1* 10.7*  HCT 35.2* 32.0* 31.6* 33.6*  MCV 83.4  82.5 82.5 83.4  PLT 255 245 229 246   Cardiac Enzymes: No results for input(s): CKTOTAL, CKMB, CKMBINDEX, TROPONINI in the last 168 hours. BNP: BNP (last 3 results) No results for input(s): BNP in the last 8760 hours.  ProBNP (last 3 results) No results for input(s): PROBNP in the last 8760 hours.  CBG:  Recent Labs Lab 09/25/14 0824  GLUCAP 77       Signed:  Tametha Banning  [facility] 09/26/2014, 1:33 PM

## 2014-09-26 NOTE — Evaluation (Signed)
Occupational Therapy Evaluation Patient Details Name: Alexandria [name] MRN: [id number] DOB: March 03, 1985 Today's Date: 09/26/2014    History of Present Illness Pt adm with lt internal carotid dissection after riding amusement ride. 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   Clinical Impression   Pt admitted with above. She demonstrates the below listed deficits and will benefit from continued OT to maximize safety and independence with BADLs.  Pt presents with mild cognitive deficits including impaired alternating and divided attention, slowed processing speed, and impaired executive functions.  Discussed return to work with her and encouraged her to discuss with MD when she is medically cleared to return to work (feel she can return from an OT perspective, as she worked last week with deficits present).   Recommend she not drive initially due to slower than normal processing speed, but this should improve.   Recommend OPOT.     Follow Up Recommendations  Outpatient OT    Equipment Recommendations  None recommended by OT    Recommendations for Other Services       Precautions / Restrictions Precautions Precautions: None      Mobility Bed Mobility Overal bed mobility: Independent                Transfers Overall transfer level: Independent                    Balance Overall balance assessment: Independent;No apparent balance deficits (not formally assessed)                                          ADL Overall ADL's : Modified independent                                             Vision Vision Assessment?: Yes Eye Alignment: Within Functional Limits Ocular Range of Motion: Within Functional Limits Alignment/Gaze Preference: Within Defined  Limits Tracking/Visual Pursuits: Able to track stimulus in all quads without difficulty Saccades: Decreased speed of saccadic movement Convergence: Within functional limits   Perception Perception Perception Tested?: Yes   Praxis Praxis Praxis tested?: Within functional limits    Pertinent Vitals/Pain Pain Assessment: Faces Faces Pain Scale: Hurts little more Pain Location: posterior neck and shoulders  Pain Descriptors / Indicators: Aching;Guarding;Grimacing Pain Intervention(s): Monitored during session     Hand Dominance Right   Extremity/Trunk Assessment Upper Extremity Assessment Upper Extremity Assessment: Overall WFL for tasks assessed   Lower Extremity Assessment Lower Extremity Assessment: Overall WFL for tasks assessed   Cervical / Trunk Assessment Cervical / Trunk Assessment: Normal   Communication Communication Communication: Expressive difficulties (occasional hesitation and word finding difficulties )   Cognition Arousal/Alertness: Awake/alert Behavior During Therapy: Anxious (possible lability ) Overall Cognitive Status: Impaired/Different from baseline Area of Impairment: Attention;Problem solving;Memory   Current Attention Level: Alternating;Divided Memory: Decreased short-term memory (slower to recall info than normal )       Problem Solving: Slow processing General Comments: Pt able to perform serial counting by 2's forward and backward while tossing a ball and ambulating.  Pt with slower than normal processing speed and minimal errors.  However, pt with very good awareness of deficits and able to self correct.      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 Diagnosis: Cognitive deficits   OT Problem List: Decreased activity tolerance;Decreased cognition   OT Treatment/Interventions: Cognitive remediation/compensation;Patient/family education    OT Goals(Current goals can be found in the care plan section) Acute Rehab OT Goals Patient Stated Goal: to get back to normal  OT Goal Formulation: With patient Time For Goal Achievement: 10/10/14 Potential to Achieve Goals: Good ADL Goals Additional ADL Goal #1: Pt will perform mod complex path finding with supervision  Additional ADL Goal #2: Pt will be able to alternate and divide attention during novel task with no cues   OT Frequency: Min 2X/week   Barriers to D/C:            Co-evaluation              End of Session Nurse Communication: Mobility status  Activity Tolerance: Patient tolerated treatment well Patient left: in bed;with call bell/phone within reach   Time: 4098-1191 OT Time Calculation (min): 72 min Charges:  OT General Charges $OT Visit: 1 Procedure OT Evaluation $Initial OT Evaluation Tier I: 1 Procedure OT Treatments $Therapeutic Activity: 53-67 mins G-Codes:    Bland Rudzinski M 10-12-14, 12:16 PM

## 2014-09-26 NOTE — Progress Notes (Signed)
  Echocardiogram 2D Echocardiogram has been performed.  Tye Savoy 09/26/2014, 3:18 PM

## 2014-09-26 NOTE — Evaluation (Signed)
Speech Language Pathology Evaluation Patient Details Name: Alexandria [name] MRN: [id number] DOB: 11/11/1985 Today's Date: 09/26/2014 Time: 1324-4010 SLP Time Calculation (min) (ACUTE ONLY): 20 min  Problem List:  Patient Active Problem List   Diagnosis Date Noted  . CVA (cerebral infarction) 09/25/2014  . Internal carotid artery dissection   . Carotid artery dissection 09/24/2014  . Anemia 09/24/2014  . Nausea and vomiting 09/24/2014   Past Medical History: History reviewed. No pertinent past medical history. Past Surgical History:  Past Surgical History  Procedure Laterality Date  . Tonsillectomy     HPI:  Alexandria [name] is a 29 y.o. female with no past medical history who comes to the hospital due to headache, right upper extremity numbness, nausea and vomiting. Per patient, on Monday she had a ride at an amusement park which had the passengers moving up and down and side words during the actual ride and she states that during this time she developed a left-sided and occipital headache associated with nausea. She also complains of having numbness on both of her upper extremities. She has some nausea and emesis the following days, but her headache seems to have improved during the week. However today patient was outside and noticed the her headache recurred and he was more intense than he had been earlier. She denies vision changes, weakness, but her mother reports mild slurred speech for a while. She denies fever, chills, dyspnea, chest pain, palpitations, diaphoresis, lower extremity edema, tinnitus or vertigo.  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 She is currently in no acute distress, she states that her pain is better, but still complains of having some nausea.   Assessment / Plan / Recommendation Clinical Impression  Cognitive/linguistic/motor speech evaluation was completed.  The patient scored a 30/30 on the Mini Mental State Exam indicating that language and  cognition were functional.  Motor speech evaluation did not show any acute issues.  The patient did complain of intermittent issues with slurred speech.  This was not observed during evaluation.  The patient was encouraged to follow up with her primary care MD if symptoms worsen.  Acute ST needs are not identified.      SLP Assessment  Patient does not need any further Speech Lanaguage Pathology Services    Follow Up Recommendations  None       Pertinent Vitals/Pain Pain Assessment: 0-10 Pain Score: 8  Faces Pain Scale: Hurts little more Pain Location: posterior neck and shoulders  Pain Descriptors / Indicators:  (neck pain) Pain Intervention(s): Monitored during session (RN was informed.)   SLP Goals  Patient/Family Stated Goal: None stated.  SLP Evaluation Prior Functioning  Cognitive/Linguistic Baseline: Within functional limits Available Help at Discharge: Family   Cognition  Overall Cognitive Status: Within Functional Limits for tasks assessed Arousal/Alertness: Awake/alert Orientation Level: Oriented X4 Attention: Focused Focused Attention: Appears intact Memory: Appears intact Awareness: Appears intact Problem Solving: Appears intact Safety/Judgment: Appears intact    Comprehension  Auditory Comprehension Overall Auditory Comprehension: Appears within functional limits for tasks assessed Commands: Within Functional Limits Conversation: Complex Reading Comprehension Reading Status: Within funtional limits    Expression Expression Primary Mode of Expression: Verbal Verbal Expression Overall Verbal Expression: Appears within functional limits for tasks assessed Initiation: No impairment Automatic Speech: Name;Social Response Level of Generative/Spontaneous Verbalization: Conversation Repetition: No impairment Naming: No impairment Pragmatics: No impairment Non-Verbal Means of Communication: Not applicable Written Expression Dominant Hand: Right Written  Expression: Within Functional Limits   Oral / Motor Oral  Motor/Sensory Function Overall Oral Motor/Sensory Function: Appears within functional limits for tasks assessed Labial ROM: Within Functional Limits Labial Symmetry: Within Functional Limits Lingual ROM: Within Functional Limits Lingual Symmetry: Within Functional Limits Facial ROM: Within Functional Limits Facial Symmetry: Within Functional Limits Mandible: Within Functional Limits Motor Speech Overall Motor Speech: Appears within functional limits for tasks assessed Respiration: Within functional limits Phonation: Normal Resonance: Within functional limits Articulation: Within functional limitis Intelligibility: Intelligible Motor Planning: Witnin functional limits Motor Speech Errors: Not applicable   GO     Fleet Contras 09/26/2014, 3:45 PM [place], MA, CCC-SLP Acute Rehab SLP [phone]

## 2014-09-26 NOTE — Progress Notes (Signed)
STROKE TEAM PROGRESS NOTE   SUBJECTIVE (INTERVAL HISTORY) Her family is at the bedside.  Overall she feels her condition is stable. She complains her neck is sore when she moves to the right. No HA or weakness. She said she still some time response slowly.    OBJECTIVE Temp:  [98.3 F (36.8 C)-99 F (37.2 C)] 98.3 F (36.8 C) (09/12 0621) Pulse Rate:  [58-85] 58 (09/12 0621) Cardiac Rhythm:  [-] Normal sinus rhythm (09/12 0758) Resp:  [20] 20 (09/12 1009) BP: (118-140)/(64-81) 130/78 mmHg (09/12 1009) SpO2:  [99 %-100 %] 100 % (09/12 1009)  CBC:   Recent Labs Lab 09/25/14 0413 09/26/14 0531  WBC 8.0 6.3  HGB 10.1* 10.7*  HCT 31.6* 33.6*  MCV 82.5 83.4  PLT 229 246    Basic Metabolic Panel:   Recent Labs Lab 09/25/14 0413 09/26/14 0531  NA 139 137  K 3.5 3.4*  CL 106 102  CO2 27 28  GLUCOSE 87 104*  BUN 7 13  CREATININE 0.78 <0.30*  CALCIUM 8.9 8.8*    Lipid Panel:     Component Value Date/Time   CHOL 144 09/25/2014 0413   TRIG 49 09/25/2014 0413   HDL 50 09/25/2014 0413   CHOLHDL 2.9 09/25/2014 0413   VLDL 10 09/25/2014 0413   LDLCALC 84 09/25/2014 0413   HgbA1c:  Lab Results  Component Value Date   HGBA1C 5.7* 09/25/2014   Urine Drug Screen: No results found for: LABOPIA, COCAINSCRNUR, LABBENZ, AMPHETMU, THCU, LABBARB    IMAGING  I have personally reviewed the radiological images below and agree with the radiology interpretations.  Ct Angio Head and Neck W/cm &/or Wo Cm 09/24/2014  1. 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 2. The left internal carotid artery is reconstituted at the level of the left ophthalmic artery and posterior communicating artery.  3. The A1 and M1 segments are symmetric.  4. No other focal vascular injury or intracranial disease.   Mr Brain Wo Contrast 09/25/2014  1. Patchy multi focal acute right MCA territory infarcts involving the posterior right  frontal and right parietal lobes. There is small amount of associated petechial hemorrhage without hemorrhagic transformation.  2. Additional tiny 4 mm acute ischemic infarct within the body of the right caudate nucleus.  3. Abnormal flow void within the petrous and proximal cavernous right ICA, consistent with known right ICA dissection.   Dg Chest Port 1 View 09/25/2014  No active disease.   2D echo -  Left ventricle: The cavity size was normal. Systolic function was normal. The estimated ejection fraction was in the range of 55% to 60%. Wall motion was normal; there were no regional wall motion abnormalities. Left ventricular diastolic function parameters were normal.  PHYSICAL EXAM Mental Status: Alert, oriented, thought content appropriate. Speech fluent without evidence of aphasia. Able to follow 3 step commands without difficulty. Cranial Nerves: II: Visual fields grossly normal, pupils equal, round, reactive to light and accommodation III,IV, VI: ptosis not present, extra-ocular motions intact bilaterally V,VII: smile symmetric, facial light touch sensation normal bilaterally VIII: hearing normal bilaterally IX,X: gag reflex present XI: bilateral shoulder shrug XII: midline tongue extension Motor: Right :Upper extremity 5/5Left: Upper extremity 5/5 Lower extremity 5/5Lower extremity 5/5 Tone and bulk:normal tone throughout; no atrophy noted Sensory: light touch intact throughout, bilaterally Cerebellar: normal finger-to-nose Gait: Deferred at this time   ASSESSMENT/PLAN [name] is a 29 y.o. female with no significant past medical  history presenting with headache, right upper extremity numbness, nausea and vomiting. She did not receive IV t-PA due to delay in arrival.   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 Additional tiny 4 mm acute ischemic infarct within the body of the right caudate nucleus.  CTA of head and neck - Focal left internal carotid carotid artery dissection approximately 1.5 cm above the bifurcation.  Repeat CTA in 3 months  2D Echo unremarkable  LDL 84  HgbA1c 5.7  VTE prophylaxis - Lovenox Diet regular Room service appropriate?: Yes; Fluid consistency:: Thin  no antithrombotic prior to admission, now on aspirin 325 mg orally every day. Continue ASA on discharge.  Patient counseled to be compliant with her antithrombotic medications  Ongoing aggressive stroke risk factor management  Therapy recommendations: OP OT, no PT  Disposition: return home Follow-up Stroke Clinic at [facility] with Dr. Marvel Plan in 2 months, order placed.  Hypertension  Stable  BP goal normotensive  Hyperlipidemia  Home meds: No lipid lowering medications prior to admission  LDL 84, goal < 70  Add Lipitor 10 mg daily.   Continue statin at discharge  Other Stroke Risk Factors  ETOH use  Other Active Problems  Anemia - to be worked up  Hospital day # 2  Neurology will sign off. Please call with questions. Pt will follow up with Dr. Roda Shutters at [facility] in about 2 months. Thanks for the consult.  Marvel Plan, MD PhD Stroke Neurology 09/26/2014 9:04 PM    To contact Stroke Continuity provider, please refer to [email or web address]. After hours, contact General Neurology

## 2014-09-26 NOTE — Care Management Note (Signed)
Case Management Note  Patient Details  Name: Vernessa Likes MRN: [id number] Date of Birth: December 22, 1985  Subjective/Objective:                    Action/Plan: Pt admitted with CVA. Pt is from home with parents. CM will continue to follow for discharge needs.   Expected Discharge Date:  09/27/14               Expected Discharge Plan:  Home/Self Care  In-House Referral:     Discharge planning Services     Post Acute Care Choice:    Choice offered to:     DME Arranged:    DME Agency:     HH Arranged:    HH Agency:     Status of Service:  In process, will continue to follow  Medicare Important Message Given:    Date Medicare IM Given:    Medicare IM give by:    Date Additional Medicare IM Given:    Additional Medicare Important Message give by:     If discussed at Long Length of Stay Meetings, dates discussed:    Additional Comments:  Florene Glen, RN 09/26/2014, 11:09 AM

## 2014-09-27 MED ORDER — OXYCODONE HCL 5 MG PO TABS
5.0000 mg | ORAL_TABLET | ORAL | Status: AC | PRN
  Administered 2014-09-27 (×2): 5 mg via ORAL
  Filled 2014-09-27 (×2): qty 1

## 2014-09-27 MED ORDER — CYCLOBENZAPRINE HCL 10 MG PO TABS
5.0000 mg | ORAL_TABLET | Freq: Once | ORAL | Status: AC
  Administered 2014-09-27: 5 mg via ORAL
  Filled 2014-09-27: qty 1

## 2014-09-27 NOTE — Progress Notes (Signed)
Occupational Therapy Treatment Patient Details Name: Alexandria [name] MRN: [id number] DOB: 11-02-1985 Today's Date: 09/27/2014    History of present illness Pt adm with lt internal carotid dissection after riding amusement ride. 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   OT comments  Pt progressing towards acute OT goals. Focus of session was complex path finding and compensation/remediation strategies for higher cognitive deficits. Session details below. D/c plan remains appropriate.   Follow Up Recommendations  Outpatient OT    Equipment Recommendations  None recommended by OT    Recommendations for Other Services      Precautions / Restrictions Precautions Precautions: None Restrictions Weight Bearing Restrictions: No       Mobility Bed Mobility Overal bed mobility: Independent                Transfers Overall transfer level: Independent                    Balance Overall balance assessment: Independent                                 ADL Overall ADL's : Modified independent                                       General ADL Comments: Discussed strategies for compensation and remidiation of higher cognitive impairments including attentional deficits including narrowing multiple options to two to pick from, decreasing background noise, working on brain games/puzzles. Pt smbulated hallways to complete complex path finding with min verbal cueing.       Vision                     Perception     Praxis      Cognition   Behavior During Therapy: [facility] for tasks assessed/performed;Anxious Overall Cognitive Status: Impaired/Different from baseline Area of Impairment: Attention;Problem solving;Memory   Current Attention Level:  Alternating;Divided Memory: Decreased short-term memory        Problem Solving: Slow processing General Comments: Pt completed complex path finding with min verbal cues. Pt with confusion on which floor she was on stating 1st while next to large windows on 5th floor. Pt reports difficulty with concentration and higher problem solving. Recommended f/u with neuropsychologist for further cognitive evaluation is symptoms not resolved in 3 months.     Extremity/Trunk Assessment               Exercises     Shoulder Instructions       General Comments      Pertinent Vitals/ Pain       Pain Assessment: No/denies pain  Home Living                                          Prior Functioning/Environment              Frequency Min 2X/week     Progress Toward Goals  OT Goals(current goals can now be found in the care plan section)  Progress towards OT goals: Progressing toward goals  Acute Rehab OT  Goals Patient Stated Goal: to get back to normal  OT Goal Formulation: With patient Time For Goal Achievement: 10/10/14 Potential to Achieve Goals: Good ADL Goals Additional ADL Goal #1: Pt will perform mod complex path finding with supervision  Additional ADL Goal #2: Pt will be able to alternate and divide attention during novel task with no cues   Plan Discharge plan remains appropriate    Co-evaluation                 End of Session     Activity Tolerance Patient tolerated treatment well   Patient Left in bed;with call bell/phone within reach   Nurse Communication Other (comment) (discussed taking pt off unit for path finding exercise)        Time: 1416-1430 OT Time Calculation (min): 14 min  Charges: OT General Charges $OT Visit: 1 Procedure OT Treatments $Self Care/Home Management : 8-22 mins  Pilar Grammes 09/27/2014, 2:42 PM

## 2014-11-03 ENCOUNTER — Emergency Department (HOSPITAL_COMMUNITY): Payer: Commercial Managed Care - HMO

## 2014-11-03 ENCOUNTER — Emergency Department (HOSPITAL_COMMUNITY)
Admission: EM | Admit: 2014-11-03 | Discharge: 2014-11-03 | Disposition: A | Discharge status: Home / Self Care | Payer: Commercial Managed Care - HMO | Attending: Emergency Medicine | Admitting: Emergency Medicine

## 2014-11-03 ENCOUNTER — Encounter (HOSPITAL_COMMUNITY): Payer: Self-pay | Admitting: Emergency Medicine

## 2014-11-03 ENCOUNTER — Telehealth: Payer: Self-pay | Admitting: Neurology

## 2014-11-03 DIAGNOSIS — Z3202 Encounter for pregnancy test, result negative: Secondary | ICD-10-CM | POA: Insufficient documentation

## 2014-11-03 DIAGNOSIS — R51 Headache: Secondary | ICD-10-CM | POA: Diagnosis not present

## 2014-11-03 DIAGNOSIS — M542 Cervicalgia: Secondary | ICD-10-CM | POA: Diagnosis not present

## 2014-11-03 DIAGNOSIS — Z7982 Long term (current) use of aspirin: Secondary | ICD-10-CM | POA: Insufficient documentation

## 2014-11-03 DIAGNOSIS — R42 Dizziness and giddiness: Secondary | ICD-10-CM | POA: Diagnosis not present

## 2014-11-03 DIAGNOSIS — Z79899 Other long term (current) drug therapy: Secondary | ICD-10-CM | POA: Diagnosis not present

## 2014-11-03 DIAGNOSIS — R000 Tachycardia, unspecified: Secondary | ICD-10-CM | POA: Diagnosis not present

## 2014-11-03 DIAGNOSIS — R197 Diarrhea, unspecified: Secondary | ICD-10-CM | POA: Diagnosis not present

## 2014-11-03 DIAGNOSIS — R112 Nausea with vomiting, unspecified: Secondary | ICD-10-CM | POA: Insufficient documentation

## 2014-11-03 DIAGNOSIS — R519 Headache, unspecified: Secondary | ICD-10-CM

## 2014-11-03 DIAGNOSIS — Z8673 Personal history of transient ischemic attack (TIA), and cerebral infarction without residual deficits: Secondary | ICD-10-CM | POA: Insufficient documentation

## 2014-11-03 HISTORY — DX: Cerebral infarction, unspecified: I63.9

## 2014-11-03 LAB — COMPREHENSIVE METABOLIC PANEL
ALBUMIN: 4.5 g/dL (ref 3.5–5.0)
ALK PHOS: 65 U/L (ref 38–126)
ALT: 18 U/L (ref 14–54)
AST: 24 U/L (ref 15–41)
Anion gap: 9 (ref 5–15)
BUN: 22 mg/dL — ABNORMAL HIGH (ref 6–20)
CALCIUM: 9.5 mg/dL (ref 8.9–10.3)
CHLORIDE: 105 mmol/L (ref 101–111)
CO2: 25 mmol/L (ref 22–32)
CREATININE: 0.99 mg/dL (ref 0.44–1.00)
GFR calc Af Amer: >60 mL/min (ref >60)
GFR calc non Af Amer: >60 mL/min (ref >60)
GLUCOSE: 96 mg/dL (ref 65–99)
Potassium: 3.7 mmol/L (ref 3.5–5.1)
Sodium: 139 mmol/L (ref 135–145)
Total Bilirubin: 0.4 mg/dL (ref 0.3–1.2)
Total Protein: 8.6 g/dL — ABNORMAL HIGH (ref 6.5–8.1)

## 2014-11-03 LAB — URINALYSIS, ROUTINE W REFLEX MICROSCOPIC
Bilirubin Urine: NEGATIVE
GLUCOSE, UA: NEGATIVE mg/dL
HGB URINE DIPSTICK: NEGATIVE
KETONES UR: NEGATIVE mg/dL
Leukocytes, UA: NEGATIVE
Nitrite: NEGATIVE
PH: 5 (ref 5.0–8.0)
PROTEIN: NEGATIVE mg/dL
Specific Gravity, Urine: 1.031 — ABNORMAL HIGH (ref 1.005–1.030)
Urobilinogen, UA: 0.2 mg/dL (ref 0.0–1.0)

## 2014-11-03 LAB — CBC
HCT: 35.3 % — ABNORMAL LOW (ref 36.0–46.0)
Hemoglobin: 11.3 g/dL — ABNORMAL LOW (ref 12.0–15.0)
MCH: 27.1 pg (ref 26.0–34.0)
MCHC: 32 g/dL (ref 30.0–36.0)
MCV: 84.7 fL (ref 78.0–100.0)
PLATELETS: 277 10*3/uL (ref 150–400)
RBC: 4.17 MIL/uL (ref 3.87–5.11)
RDW: 14.9 % (ref 11.5–15.5)
WBC: 7.5 10*3/uL (ref 4.0–10.5)

## 2014-11-03 LAB — POC URINE PREG, ED: Preg Test, Ur: NEGATIVE

## 2014-11-03 MED ORDER — DIPHENHYDRAMINE HCL 50 MG/ML IJ SOLN
50.0000 mg | Freq: Once | INTRAMUSCULAR | Status: DC
  Filled 2014-11-03: qty 1

## 2014-11-03 MED ORDER — ONDANSETRON HCL 4 MG/2ML IJ SOLN
4.0000 mg | Freq: Once | INTRAMUSCULAR | Status: AC
  Administered 2014-11-03: 4 mg via INTRAVENOUS
  Filled 2014-11-03: qty 2

## 2014-11-03 MED ORDER — SODIUM CHLORIDE 0.9 % IV BOLUS (SEPSIS)
1000.0000 mL | Freq: Once | INTRAVENOUS | Status: AC
  Administered 2014-11-03: 1000 mL via INTRAVENOUS

## 2014-11-03 MED ORDER — ONDANSETRON HCL 4 MG PO TABS: 4.0000 mg | ORAL_TABLET | Freq: Three times a day (TID) | ORAL | Status: DC | PRN

## 2014-11-03 MED ORDER — IOHEXOL 350 MG/ML SOLN
100.0000 mL | Freq: Once | INTRAVENOUS | Status: AC | PRN
  Administered 2014-11-03: 100 mL via INTRAVENOUS

## 2014-11-03 NOTE — ED Provider Notes (Signed)
CSN: [id number]     Arrival date & time 11/03/14  1148 History   First MD Initiated Contact with Patient 11/03/14 1252     Chief Complaint  Patient presents with  . Nausea     (Consider location/radiation/quality/duration/timing/severity/associated sxs/prior Treatment) HPI   29 year old female with prior history of carotid dissection and stroke who presents for evaluation of nausea dizziness and confusion. Patient states a month ago she developed headache and right-sided neck pain after riding on a roller coaster. She was seen in the ED and evaluated for her condition and subsequently was diagnosed with a right ICA dissection an acute stroke. 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 She was discharged with aspirin and Lipitor. She has been doing fine but for the past 2 days she has been having intermittent bouts of nausea. Today she also complaining of occasional sharp occipital headache lasting for seconds to minutes. No active headache at this time. She also experiencing intermittent episodes of dizziness which she described as room spinning sensation with mental confusion. Her condition causing her some concern as his somewhat similar to prior dissection although less intense. She reports she did not receive any specific surgical treatment for her dissection. She does not have a primary care provider and her next follow-up appointment with neurologist is in 2 months. At this time patient denies having any active pain. Denies neck pain, headache, chest pain, difficulty breathing, focal numbness or weakness.  Past Medical History  Diagnosis Date  . Stroke [facility])    Past Surgical History  Procedure Laterality Date  . Tonsillectomy     Family History  Problem Relation Age of Onset  . Breast cancer Mother   . Diabetes Mellitus II Mother    Social History  Substance Use Topics  . Smoking status: Never Smoker   . Smokeless tobacco: None  . Alcohol Use: Yes     Comment:  socially   OB History    No data available     Review of Systems  All other systems reviewed and are negative.     Allergies  Fish-derived products  Home Medications   Prior to Admission medications   Medication Sig Start Date End Date Taking? Authorizing Provider  aspirin EC 325 MG EC tablet Take 1 tablet (325 mg total) by mouth daily. 09/26/14  Yes Zannie Cove, MD  atorvastatin (LIPITOR) 10 MG tablet Take 1 tablet (10 mg total) by mouth daily at 6 PM. 09/26/14  Yes Zannie Cove, MD  acetaminophen (TYLENOL) 325 MG tablet Take 2 tablets (650 mg total) by mouth every 6 (six) hours as needed for moderate pain, fever or headache. Patient not taking: Reported on 11/03/2014 09/26/14   Zannie Cove, MD   BP 141/87 mmHg  Pulse 113  Temp(Src) 98.4 F (36.9 C) (Oral)  Resp 16  SpO2 100%  LMP 10/31/2014 Physical Exam  Constitutional: She appears well-developed and well-nourished. No distress.  HENT:  Head: Atraumatic.  Eyes: Conjunctivae are normal.  Neck: Neck supple.  No carotid bruit. No nuchal rigidity  Cardiovascular:  Tachycardia without murmurs rubs or gallops  Pulmonary/Chest: Effort normal and breath sounds normal.  Abdominal: Soft. There is no tenderness.  Musculoskeletal:  5/5 strength to all 4 extremities with intact distal pulses.  Neurological: She is alert.  Neurologic exam:  Speech clear, pupils equal round reactive to light, extraocular movements intact  Normal peripheral visual fields Cranial nerves III through XII normal including no facial droop Follows commands, moves all extremities  x4, normal strength to bilateral upper and lower extremities at all major muscle groups including grip Sensation normal to light touch  Coordination intact, no limb ataxia, finger-nose-finger normal Rapid alternating movements normal No pronator drift Gait normal   Skin: No rash noted.  Psychiatric: She has a normal mood and affect.  Nursing note and vitals  reviewed.   ED Course  Procedures (including critical care time)  Patient with recent carotid dissection with nausea and dizziness and mild mental confusion. She does not have any active headache or neck pain at this time and appears comfortable. Given her recent history, workup initiated including head/neck CT angio for further evaluation.  Care discussed with Dr. Clayborne Dana.   3:39 PM Pt did mentioned she has had mild loose stools for the past several days in accompany with her nausea and vomiting.  She has a benign abdominal exam.  If her CT scans are unremarkable, her sxs likely 2/2 to viral GI and less likely to be dissection/stroke.  Pt in no acute distress.    3:57 PM Head and neck CT scan demonstrated subacute infarcts the right posterior frontal lobe as noted on prior studies with no new area of infarction. Occlusion of the proximal right internal carotid artery on the prior CTA has recanalized and is now patent. No other acute finding noted. At this time I suspect patient's symptoms likely secondary to viral GI illness likely to be in a stroke or dissection. She is well-appearing and comfortable. She does not have any active pain at this time. She feels comfortable following up with her neurologist as scheduled for further management. Zofran prescribed as needed for nausea. Return precautions discussed. Otherwise patient tolerated by mouth and stable for discharge.  Labs Review Labs Reviewed  COMPREHENSIVE METABOLIC PANEL - Abnormal; Notable for the following:    BUN 22 (*)    Total Protein 8.6 (*)    All other components within normal limits  CBC - Abnormal; Notable for the following:    Hemoglobin 11.3 (*)    HCT 35.3 (*)    All other components within normal limits  URINALYSIS, ROUTINE W REFLEX MICROSCOPIC (NOT AT [facility]) - Abnormal; Notable for the following:    APPearance CLOUDY (*)    Specific Gravity, Urine 1.031 (*)    All other components within normal limits  POC URINE PREG,  ED    Imaging Review Ct Angio Head W/cm &/or Wo Cm  11/03/2014  CLINICAL DATA:  History of right carotid dissection and right MCA stroke. Dizziness and headache. EXAM: CT ANGIOGRAPHY HEAD AND NECK TECHNIQUE: Multidetector CT imaging of the head and neck was performed using the standard protocol during bolus administration of intravenous contrast. Multiplanar CT image reconstructions and MIPs were obtained to evaluate the vascular anatomy. Carotid stenosis measurements (when applicable) are obtained utilizing NASCET criteria, using the distal internal carotid diameter as the denominator. CONTRAST:  OMNIPAQUE IOHEXOL 350 MG/ML SOLN COMPARISON:  The MRI and CT 09/02/2014 FINDINGS: CT HEAD Brain: Hypodensity right posterior frontal lobe compatible with resolving infarct. This area showed restricted diffusion on the prior MRI. No new area of acute infarction. Negative for hemorrhage or mass. Ventricle size normal. Calvarium and skull base: Negative Paranasal sinuses: Negative Orbits: Negative CTA NECK Aortic arch: Normal aortic arch.  Proximal great vessels normal. Right carotid system: Right common carotid artery widely patent. Prior study demonstrated occluded right internal carotid artery just above the bifurcation compatible with dissection. This has recanalized and is now patent. The  right internal carotid artery is mildly narrowed and nearly returned to normal caliber. No intimal flap or aneurysm identified. Negative for fibromuscular dysplasia. Left carotid system: Normal Vertebral arteries:Both vertebral arteries widely patent the basilar without stenosis or dissection. Skeleton: Negative Other neck: Negative CTA HEAD Anterior circulation: Cavernous carotid is patent bilaterally without significant stenosis. Anterior and middle cerebral arteries widely patent without stenosis or aneurysm. Posterior circulation: Both vertebral arteries patent to the basilar. PICA patent bilaterally. Basilar patent.  Left AICA patent. Superior cerebellar and posterior cerebral arteries patent bilaterally. Venous sinuses: Patent Anatomic variants: None Delayed phase: Normal enhancement on delayed imaging. IMPRESSION: Subacute infarct right posterior frontal lobe as noted on prior studies. No new area of infarction Occlusion of the proximal right internal carotid artery on the prior CTA has recanalized and is now patent. No evidence of residual intimal flap or aneurysm. No evidence of fibromuscular dysplasia. Right internal carotid artery has nearly returned to normal caliber Normal left internal carotid artery and normal vertebral artery bilaterally. Electronically Signed   By: Marlan Palauharles  Clark M.D.   On: 11/03/2014 15:43   Ct Angio Neck W/cm &/or Wo/cm  11/03/2014  CLINICAL DATA:  History of right carotid dissection and right MCA stroke. Dizziness and headache. EXAM: CT ANGIOGRAPHY HEAD AND NECK TECHNIQUE: Multidetector CT imaging of the head and neck was performed using the standard protocol during bolus administration of intravenous contrast. Multiplanar CT image reconstructions and MIPs were obtained to evaluate the vascular anatomy. Carotid stenosis measurements (when applicable) are obtained utilizing NASCET criteria, using the distal internal carotid diameter as the denominator. CONTRAST:  100mL OMNIPAQUE IOHEXOL 350 MG/ML SOLN COMPARISON:  The MRI and CT 09/02/2014 FINDINGS: CT HEAD Brain: Hypodensity right posterior frontal lobe compatible with resolving infarct. This area showed restricted diffusion on the prior MRI. No new area of acute infarction. Negative for hemorrhage or mass. Ventricle size normal. Calvarium and skull base: Negative Paranasal sinuses: Negative Orbits: Negative CTA NECK Aortic arch: Normal aortic arch.  Proximal great vessels normal. Right carotid system: Right common carotid artery widely patent. Prior study demonstrated occluded right internal carotid artery just above the bifurcation  compatible with dissection. This has recanalized and is now patent. The right internal carotid artery is mildly narrowed and nearly returned to normal caliber. No intimal flap or aneurysm identified. Negative for fibromuscular dysplasia. Left carotid system: Normal Vertebral arteries:Both vertebral arteries widely patent the basilar without stenosis or dissection. Skeleton: Negative Other neck: Negative CTA HEAD Anterior circulation: Cavernous carotid is patent bilaterally without significant stenosis. Anterior and middle cerebral arteries widely patent without stenosis or aneurysm. Posterior circulation: Both vertebral arteries patent to the basilar. PICA patent bilaterally. Basilar patent. Left AICA patent. Superior cerebellar and posterior cerebral arteries patent bilaterally. Venous sinuses: Patent Anatomic variants: None Delayed phase: Normal enhancement on delayed imaging. IMPRESSION: Subacute infarct right posterior frontal lobe as noted on prior studies. No new area of infarction Occlusion of the proximal right internal carotid artery on the prior CTA has recanalized and is now patent. No evidence of residual intimal flap or aneurysm. No evidence of fibromuscular dysplasia. Right internal carotid artery has nearly returned to normal caliber Normal left internal carotid artery and normal vertebral artery bilaterally. Electronically Signed   By: Marlan Palauharles  Clark M.D.   On: 11/03/2014 15:43   I have personally reviewed and evaluated these images and lab results as part of my medical decision-making.   EKG Interpretation None      MDM   Final diagnoses:  Dizziness  Headache  Nausea vomiting and diarrhea    BP 111/73 mmHg  Pulse 84  Temp(Src) 98.4 F (36.9 C) (Oral)  Resp 16  SpO2 98%  LMP 10/31/2014     Fayrene Helper, PA-C 11/03/14 1607  Marily Memos, MD 11/04/14 [phone]

## 2014-11-03 NOTE — ED Notes (Signed)
Per pt, states nausea-states she had a carotid artery to dissection in September-wants to know if that is what is happening

## 2014-11-03 NOTE — Discharge Instructions (Signed)
Your symptoms may be related to a viral stomach infection.  Stay hydrated and take zofran as needed for nausea.  Follow up with your doctor for further care.  Return promptly if your condition worsen or if you have other concerns.    Emergency Department Resource Guide 1) Find a Doctor and Pay Out of Pocket Although you won't have to find out who is covered by your insurance plan, it is a good idea to ask around and get recommendations. You will then need to call the office and see if the doctor you have chosen will accept you as a new patient and what types of options they offer for patients who are self-pay. Some doctors offer discounts or will set up payment plans for their patients who do not have insurance, but you will need to ask so you aren't surprised when you get to your appointment.  2) Contact Your Local Health Department Not all health departments have doctors that can see patients for sick visits, but many do, so it is worth a call to see if yours does. If you don't know where your local health department is, you can check in your phone book. The CDC also has a tool to help you locate your state's health department, and many state websites also have listings of all of their local health departments.  3) Find a Walk-in Clinic If your illness is not likely to be very severe or complicated, you may want to try a walk in clinic. These are popping up all over the country in pharmacies, drugstores, and shopping centers. They're usually staffed by nurse practitioners or physician assistants that have been trained to treat common illnesses and complaints. They're usually fairly quick and inexpensive. However, if you have serious medical issues or chronic medical problems, these are probably not your best option.  No Primary Care Doctor: - Call Health Connect at  [phone] - they can help you locate a primary care doctor that  accepts your insurance, provides certain services, etc. - Physician  Referral Service- [phone]  Chronic Pain Problems: Organization         Address  Phone   Notes  [facility]  [phone] Patients need to be referred by their primary care doctor.   Medication Assistance: Organization         Address  Phone   Notes  [facility] [address] [place], [place] [address] [phone] --Must be a resident of [facility] -- Must have NO insurance coverage whatsoever (no Medicaid/ Medicare, etc.) -- The pt. MUST have a primary care doctor that directs their care regularly and follows them in the community   MedAssist  [phone]   [employer]  [phone]    Agencies that provide inexpensive medical care: Organization         Address  Phone   Notes  [facility]  [phone]   [facility]    [phone]   [facility] [address] [place], [place] [address] [phone]   [facility] [address], [place] [phone]   Planned Parenthood    [phone]   [facility]    [phone]) [phone]   [facility]  [address], [place] Phone:  [phone], Fax:  [phone] Hours of Operation:  9 am - 6 pm, M-F.  Also  accepts Medicaid/Medicare and self-pay.  [facility]  [address], [place] Phone: [phone], Fax: [phone]. Hours of Operation:  8:30 am - 5:30 pm, M-F.  Also accepts Medicaid and self-pay.  [facility] [place] [address], [place] Phone: [phone]   [facility] [address] Natasha Bence [place], [place] [phone], Ext. [phone] Mondays & Thursdays: 7-9 AM.  First 15 patients are seen on a first come, first serve basis.    Medicaid-accepting [facility] Providers:  Organization         Address  Phone   Notes  [facility] [address], [place] [phone] Also accepts self-pay patients.  [facility] [address] Laurell Josephs [place], [place]  [phone]   [facility] [address], [place] [phone]   [facility] [address] [place] [phone]   Renaye Rakers [address], [place]   [phone] Only accepts [place] Access [place] patients after they have their name applied to their card.   Self-Pay (no insurance) in [facility]:  Organization         Address  Phone   Notes  Sickle Cell Patients, [facility] [address] [place], [place] [phone]   [facility] [address] [place], [place] [phone]   [facility] [place]  [address], [place] [phone]   [facility]/Dr. Osei-Bonsu  [address], [place] or [address], [place] [phone] Phone number for both [place] and [place] locations is the same.  [facility]gent [facility] and [facility] [address] [place] [phone]   [facility] [address] [place] or [address] [phone]   [facility] [address] [place] [phone], phone; [phone], fax Sees patients 1st and 3rd Saturday of every month.  Must not qualify for public or private insurance (i.e. Medicaid, Medicare, [place] Health Choice, Veterans' Benefits)  Household income should be no more than 200% of the poverty level The clinic cannot treat you if you are pregnant or think you are pregnant  Sexually transmitted diseases are not treated at the clinic.    Dental Care: Organization         Address  Phone  Notes  [facility] [address] [place], [place] [phone] Accepts children up to age 68 who are enrolled  in [place] or [place] Health Choice; pregnant women with a Medicaid card; and children who have applied for Medicaid or [place] Health Choice, but were declined, whose parents can pay a reduced fee at time of service.  [facility]  [address], [place] [phone] Accepts children up to age 59 who are enrolled in [place] or [place] Health Choice; pregnant women with a Medicaid card; and children who have applied for Medicaid or [place] Health Choice, but were declined, whose parents can pay a reduced fee at time of service.  [facility]  [address] [place], [place] [phone] Patients are seen by appointment only. Walk-ins are not accepted. [facility] will see patients 59 years of age and older. Monday - Tuesday (8am-5pm)  Most Wednesdays (8:30-5pm) $30 per visit, cash only  [facility] [facility]  [address], [facility] [phone] Patients are seen by appointment only. Walk-ins are not accepted. [facility] will see patients 30 years of age and older. One Wednesday Evening (Monthly: Volunteer Based).  $30 per visit, cash only  [employer] [facility]  [phone] for adults; Children under age 444, call Graduate Pediatric Dentistry at [phone]) [phone]. Children aged 174-14, please call [phone] to request a pediatric application.  Dental services are provided in all areas of dental care including fillings, crowns and bridges, complete and partial dentures, implants, gum treatment, root canals, and extractions. Preventive care is also provided. Treatment is provided to both adults and children. Patients are selected via a lottery and there is often a waiting list.   [facility] [address], [place]  [phone] [email or web address]   [facility] [address], [place], [place] [phone], Ext. [phone] Second and Fourth Thursday of each month, opens at  6:30 AM; Clinic ends at 9 AM.  Patients are seen on a first-come first-served basis, and a limited number are seen during each clinic.   [facility]  [address], [place], [place] [phone]) [phone]   Eligibility Requirements You must have lived in [place], [place], or [place] counties for at least the last three months.   You cannot be eligible for state or federal sponsored National Cityhealthcare insurance, including CIGNAVeterans Administration, [place]icaid, or [employer]Medicare.   You generally cannot be eligible for healthcare insurance through your employer.    How to apply: Eligibility screenings are held every Tuesday and Wednesday afternoon from 1:00 pm until 4:00 pm. You do not need an appointment for the interview!  [facility] [address], [place], [place] [phone]   [facility]  [phone]   [facility]  [phone]   [facility]  [phone]    Behavioral Health Resources in the Community: Intensive Outpatient Programs Organization         Address  Phone  Notes  [facility] [address], [place], [place] [phone]   [facility] [address], [place], [place] [phone]   [facility] [address], [place], [place]  [phone]   [facility] [address],  [place], [place] [phone] or [phone]   Substance Abuse Resources Organization         Address  Phone  Notes  [facility] Drug Services  [phone]   [facility]  [phone]   [facility]  [phone]   [facility]  [phone]   Residential & Outpatient Substance Abuse Program  [phone]   Psychological Services Organization         Address  Phone  Notes  [facility]  [phone]- [phone]   [facility]  [phone]- [phone]   [facility] [address], [place]  [phone] or [phone]    Mobile Crisis Teams Organization         Address  Phone  Notes  [facility], Mobile Crisis Care Unit  [phone]   [facility]  [address] [place], [place] [phone]   Doristine LocksSharon DeEsch [address] [place] [phone]    Self-Help/Support Groups Organization         Address  Phone  Notes  [facility] [place] - variety of support groups  [place] Call for more information  Narcotics Anonymous (NA), [facility] [address], [employer] [place]  2 meetings at this location   Special educational needs teacher         Address  Phone  Notes  [facility] Residential Treatment [place],    [place]  [phone]   [facility]  [address], [place] 641583, [place], [place]   [place], [place] [phone] Admissions: 8am-3pm M-F  Incentives Substance [place] [address],    [place], [place] [phone]   [facility] [address] [place], [place], [place]   [facility] [address],  [place], [place]   Insight Programs - Intensive Outpatient [address] Kristeen Mans [address], [place], [place]   [facility] ([place] [address].,  [place], [place] [phone] or [phone]   [facility] (RTS) [address] [place], [place] Accepts Medicaid  [facility] [place] [address]  [place] [phone] Substance Abuse/Addiction Treatment   [facility] Organization         Address  Phone  Notes  [facility]  [phone]   Domenic Schwab, PhD [address] Arlis Porta [place], [place]   [phone] or [phone]   [place], [place] [phone]   [facility]  [address] [place], [place] [phone] Insurance/Medicaid/sponsorship through [facility] and Families [address] [place]                                    [place], [place] [phone] [place] [address], [place] [phone]    Dr. Adele Schilder  [phone]   [facility] 1) [address], [place] 2) [place] 3)  [place] [address], [place] [phone]  [phone]   [place] [phone] or [phone] (After Hours)

## 2014-11-03 NOTE — ED Notes (Signed)
Bed: WG95WA23 Expected date:  Expected time:  Means of arrival:  Comments: Needs new curtain, then hall C

## 2014-11-03 NOTE — Telephone Encounter (Signed)
Pt called to make an earlier appt. She feels that her artery has torn again and is feeling nausous like she did the last time. She also stated that she is currently off of her blood thinners ( 7 days) and wants to know if she needs to keep taking them. If so she will need another Rx. Please call and advise [phone]

## 2014-11-03 NOTE — Telephone Encounter (Signed)
Rn call patient back about her feeling nausea. Pt stated she thinks she has torn her artery again. Rn explain that Dr. Roda ShuttersXu did not prescribed any of her medications. Pt stated the hospital MD prescribed the medication. Pt stated she does not have a primary doctor, she is new to the area. Pt thought lipitor was a blood thinner. Rn explain to patient that lipitor is not a blood thinner its a medication to treat her cholesterol. Pt verbalized understanding of lipitor. Pt stated she is taking aspirin and wanted to know if she should stop taking it. Rn explain to patient if she is feeling nausea, and feel she has torn her artery, she needs to visit the emergency room. Rn also explain to patient that her symptoms seem l to be a urgent issue. Pt stated she was going to wait until the weekend to see if she feels better. Rn advise the patient to go the emergency room today because of her symptoms. Rn also advise the patient to find a primary doctor for her overall care. Rn explain to patient that Dr.Xu will be only seeing her for neurological issues. Pt stated she will keep her appointment for Dr. Roda ShuttersXu on 11-30-14.

## 2014-11-30 ENCOUNTER — Encounter: Payer: Self-pay | Admitting: Neurology

## 2014-11-30 ENCOUNTER — Ambulatory Visit (INDEPENDENT_AMBULATORY_CARE_PROVIDER_SITE_OTHER): Payer: Commercial Managed Care - HMO | Admitting: Neurology

## 2014-11-30 VITALS — BP 121/80 | HR 76 | Ht 67.0 in | Wt 205.0 lb

## 2014-11-30 DIAGNOSIS — F32A Depression, unspecified: Secondary | ICD-10-CM

## 2014-11-30 DIAGNOSIS — I63131 Cerebral infarction due to embolism of right carotid artery: Secondary | ICD-10-CM | POA: Diagnosis not present

## 2014-11-30 DIAGNOSIS — F411 Generalized anxiety disorder: Secondary | ICD-10-CM | POA: Diagnosis not present

## 2014-11-30 DIAGNOSIS — F329 Major depressive disorder, single episode, unspecified: Secondary | ICD-10-CM | POA: Diagnosis not present

## 2014-11-30 DIAGNOSIS — I7771 Dissection of carotid artery: Secondary | ICD-10-CM | POA: Diagnosis not present

## 2014-11-30 MED ORDER — CITALOPRAM HYDROBROMIDE 10 MG PO TABS: 10.0000 mg | ORAL_TABLET | Freq: Every day | ORAL | Status: DC

## 2014-11-30 NOTE — Progress Notes (Signed)
STROKE NEUROLOGY FOLLOW UP NOTE  NAME: Alexandria [name] DOB: 04-Mar-1985  REASON FOR VISIT: stroke follow up HISTORY FROM: pt and chart  Today we had the pleasure of seeing Alexandria [name] in follow-up at our Neurology Clinic. Pt was accompanied by no one.   History Summary [name] is a 29 y.o. female with no significant past medical history presenting with headache, right upper extremity numbness, nausea and vomiting after a roller coaster ride. 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 TEE and A1C unremarkable and LDL 84. Her symptoms resolved and she was discharged with ASA and lipitor 10 and plan for repeat CTA in 3 months.    Interval History During the interval time, the patient has been doing well until 11/03/14 when she developed nausea vomiting and some occipital HA. She went to ED and had CTA head and neck done showed right ICA has recannulizated. She was told to have gastritis and prescribed zofran PRN. During that visit, her BP 141/89. However, regularly her BP 120/80 as today in clinic. Since then, pt has been doing well. No acute issues.   She also complains of emotional outburst, anxiety and depression after the anxiety spells. She felt not able to function well after taking lipitor in the evenings. Other than that, she has been back to work and doing reasonably well.   REVIEW OF SYSTEMS: Full 14 system review of systems performed and notable only for those listed below and in HPI above, all others are negative:  Constitutional:  fatigue Cardiovascular:  Ear/Nose/Throat:   Skin:  Eyes:  Light sensitivity Respiratory:   Gastroitestinal:   Genitourinary:  Hematology/Lymphatic:  Bruise easily Endocrine: cold intolerance Musculoskeletal:   Allergy/Immunology:  Food allergy Neurological:  Memory loss, speech difficulty, weakness Psychiatric: agitation, confusion, depression, anxiety,  nervous, hyperactive Sleep: frequent waking, daytime sleepiness  The following represents the patient's updated allergies and side effects list: Allergies  Allergen Reactions  . Fish-Derived Products Nausea Only    Patient stated she gets very nauseated just smelling seafood.    The neurologically relevant items on the patient's problem list were reviewed on today's visit.  Neurologic Examination  A problem focused neurological exam (12 or more points of the single system neurologic examination, vital signs counts as 1 point, cranial nerves count for 8 points) was performed.  Blood pressure 121/80, pulse 76, height 5\' 7"  (1.702 m), weight 205 lb (92.987 kg), last menstrual period 11/27/2014.  General - Well nourished, well developed, in no apparent distress.  Ophthalmologic - Sharp disc margins OU.   Cardiovascular - Regular rate and rhythm with no murmur.  Mental Status -  Level of arousal and orientation to time, place, and person were intact. Language including expression, naming, repetition, comprehension was assessed and found intact. Fund of Knowledge was assessed and was intact.  Cranial Nerves II - XII - II - Visual field intact OU. III, IV, VI - Extraocular movements intact. V - Facial sensation intact bilaterally. VII - Facial movement intact bilaterally. VIII - Hearing & vestibular intact bilaterally. X - Palate elevates symmetrically. XI - Chin turning & shoulder shrug intact bilaterally. XII - Tongue protrusion intact.  Motor Strength - The patient's strength was normal in all extremities and pronator drift was absent.  Bulk was normal and fasciculations were absent.   Motor Tone - Muscle tone was assessed at the neck and appendages and was normal.  Reflexes -  The patient's reflexes were 1+ in all extremities and she had no pathological reflexes.  Sensory - Light touch, temperature/pinprick, vibration and proprioception, and Romberg testing were assessed and  were normal.    Coordination - The patient had normal movements in the hands and feet with no ataxia or dysmetria.  Tremor was absent.  Gait and Station - The patient's transfers, posture, gait, station, and turns were observed as normal.  Data reviewed: I personally reviewed the images and agree with the radiology interpretations.  Ct Angio Head and Neck W/cm &/or Wo Cm 09/24/2014  1. 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 2. The left internal carotid artery is reconstituted at the level of the left ophthalmic artery and posterior communicating artery.  3. The A1 and M1 segments are symmetric.  4. No other focal vascular injury or intracranial disease.   CTA head and neck 11/03/14 Subacute infarct right posterior frontal lobe as noted on prior studies. No new area of infarction Occlusion of the proximal right internal carotid artery on the prior CTA has recanalized and is now patent. No evidence of residual intimal flap or aneurysm. No evidence of fibromuscular dysplasia. Right internal carotid artery has nearly returned to normal caliber Normal left internal carotid artery and normal vertebral artery bilaterally.  Mr Brain Wo Contrast 09/25/2014  1. Patchy multi focal acute right MCA territory infarcts involving the posterior right frontal and right parietal lobes. There is small amount of associated petechial hemorrhage without hemorrhagic transformation.  2. Additional tiny 4 mm acute ischemic infarct within the body of the right caudate nucleus.  3. Abnormal flow void within the petrous and proximal cavernous right ICA, consistent with known right ICA dissection.   Dg Chest Port 1 View 09/25/2014  No active disease.   2D echo -  Left ventricle: The cavity size was normal. Systolic function was normal. The estimated ejection fraction was in the range of 55% to 60%. Wall motion was normal; there  were no regional wall motion abnormalities. Left ventricular diastolic function parameters were normal.  Component     Latest Ref Rng 09/25/2014  Cholesterol     0 - 200 mg/dL 191  Triglycerides     <150 mg/dL 49  HDL Cholesterol     >40 mg/dL 50  Total CHOL/HDL Ratio      2.9  VLDL     0 - 40 mg/dL 10  LDL (calc)     0 - 99 mg/dL 84  Hemoglobin Y7W     4.8 - 5.6 % 5.7 (H)  Mean Plasma Glucose      117  TSH     0.350 - 4.500 uIU/mL 1.309  Vitamin B-12     180 - 914 pg/mL 251  Folate     >5.9 ng/mL 13.0  Sed Rate     0 - 22 mm/hr 50 (H)  CRP     <1.0 mg/dL 0.6  ANA Ab, IFA      Negative    Assessment: As you may recall, she is a 29 y.o. African American female with PMH of no significant past medical history was admitted on 09/24/14 for  presenting with headache, right upper extremity numbness, nausea and vomiting after a roller coaster ride. 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 TEE and A1C unremarkable and LDL 84. Her symptoms resolved and she was discharged with ASA and  lipitor 10. On 11/03/14 she developed nausea vomiting and repeat CTA head and neck done showed right ICA has recannulizated. Her BP 141/89 at that time and usually her BP 120/80. Etiology for her N/V not clear, but could be due to elevated BP in the setting of recannulization of right ICA (hyperperfusion syndrome). She also complains of emotional outburst, anxiety and depression after the anxiety spells. She felt not able to function well after taking lipitor in the evenings. Will prescribe celexa and d/c lipitor. Continue ASA.  Plan:  - continue ASA for stroke prevention - discontinue lipitor due to intolerance. 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  I spent more than 25 minutes of face to  face time with the patient. Greater than 50% of time was spent in counseling and coordination of care. We have discussed about anxiety and depression post stroke and review images of right ICA recannulization.   No orders of the defined types were placed in this encounter.    Meds ordered this encounter  Medications  . citalopram (CELEXA) 10 MG tablet    Sig: Take 1 tablet (10 mg total) by mouth daily.    Dispense:  30 tablet    Refill:  3    Patient Instructions  - continue ASA for stroke prevention - discontinue lipitor as you are not tolerating. 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    Marvel Plan, MD PhD [facility] [address], [address] [place], [place] [address] [phone]

## 2014-11-30 NOTE — Patient Instructions (Addendum)
-   continue ASA for stroke prevention - discontinue lipitor as you are not tolerating. 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

## 2015-03-29 ENCOUNTER — Ambulatory Visit: Payer: Commercial Managed Care - HMO | Admitting: Neurology

## 2015-04-19 ENCOUNTER — Other Ambulatory Visit: Payer: Self-pay | Admitting: Neurology

## 2015-04-21 ENCOUNTER — Other Ambulatory Visit: Payer: Self-pay

## 2015-04-21 DIAGNOSIS — F411 Generalized anxiety disorder: Secondary | ICD-10-CM

## 2015-04-21 DIAGNOSIS — F329 Major depressive disorder, single episode, unspecified: Secondary | ICD-10-CM

## 2015-04-21 DIAGNOSIS — F32A Depression, unspecified: Secondary | ICD-10-CM

## 2015-04-21 MED ORDER — CITALOPRAM HYDROBROMIDE 10 MG PO TABS: 10.0000 mg | ORAL_TABLET | Freq: Every day | ORAL | Status: DC

## 2015-04-26 ENCOUNTER — Telehealth: Payer: Self-pay

## 2015-04-26 NOTE — Telephone Encounter (Signed)
IF patient calls back she needs to be r/s with Dr. Roda ShuttersXu. Dr. Roda ShuttersXu saw her in the hospital last year. Per Dr. Roda ShuttersXu the appt with Dr. Terrace ArabiaYan needs to be cancel, he did not refer patient to Dr. Terrace ArabiaYan .Thanks LFt vm for patient to call back.

## 2015-04-27 ENCOUNTER — Telehealth: Payer: Self-pay

## 2015-04-27 NOTE — Telephone Encounter (Signed)
LFt vm for patient to call back to r/s with Dr.Xu for follow up.

## 2015-04-27 NOTE — Telephone Encounter (Signed)
If patient calls back please r/s her with Dr. Roda ShuttersXu. Cancel her appt with Dr. Terrace ArabiaYan. SHe is a patient of Dr.XU.

## 2015-04-27 NOTE — Telephone Encounter (Signed)
Made in error

## 2015-04-28 NOTE — Telephone Encounter (Signed)
Patient is schedule with Dr. Roda ShuttersXu follow up in June 2017 per appts.

## 2015-05-23 ENCOUNTER — Ambulatory Visit: Payer: Commercial Managed Care - HMO | Admitting: Neurology

## 2015-05-24 ENCOUNTER — Other Ambulatory Visit: Payer: Self-pay | Admitting: Neurology

## 2015-06-28 ENCOUNTER — Ambulatory Visit: Payer: Commercial Managed Care - HMO | Admitting: Neurology

## 2015-06-28 ENCOUNTER — Encounter: Payer: Self-pay | Admitting: Neurology

## 2015-06-28 ENCOUNTER — Ambulatory Visit (INDEPENDENT_AMBULATORY_CARE_PROVIDER_SITE_OTHER): Payer: Commercial Managed Care - HMO | Admitting: Neurology

## 2015-06-28 VITALS — BP 133/83 | HR 85 | Ht 67.0 in | Wt 214.6 lb

## 2015-06-28 DIAGNOSIS — F32A Depression, unspecified: Secondary | ICD-10-CM

## 2015-06-28 DIAGNOSIS — F411 Generalized anxiety disorder: Secondary | ICD-10-CM

## 2015-06-28 DIAGNOSIS — I63131 Cerebral infarction due to embolism of right carotid artery: Secondary | ICD-10-CM

## 2015-06-28 DIAGNOSIS — I7771 Dissection of carotid artery: Secondary | ICD-10-CM

## 2015-06-28 DIAGNOSIS — F329 Major depressive disorder, single episode, unspecified: Secondary | ICD-10-CM

## 2015-06-28 NOTE — Patient Instructions (Signed)
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

## 2015-06-28 NOTE — Progress Notes (Signed)
STROKE NEUROLOGY FOLLOW UP NOTE  NAME: Alexandria [name] DOB: 04/15/1985  REASON FOR VISIT: stroke follow up HISTORY FROM: pt and chart  Today we had the pleasure of seeing Alexandria [name] in follow-up at our Neurology Clinic. Pt was accompanied by no one.   History Summary Alexandria [name] is a 30 y.o. female with no significant past medical history presenting with headache, right upper extremity numbness, nausea and vomiting after a roller coaster ride. 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 TEE and A1C unremarkable and LDL 84. Her symptoms resolved and she was discharged with ASA and lipitor 10 and [name] for repeat CTA in 3 months.    11/30/14 follow up - the patient has been doing well until 11/03/14 when she developed nausea vomiting and some occipital HA. She went to ED and had CTA head and neck done showed right ICA has recannulizated. She was told to have gastritis and prescribed zofran PRN. During that visit, her BP 141/89. However, regularly her BP 120/80 as today in clinic. Since then, pt has been doing well. No acute issues.  She also complains of emotional outburst, anxiety and depression after the anxiety spells. She felt not able to function well after taking lipitor in the evenings. Other than that, she has been back to work and doing reasonably well.   Interval History During the interval time, pt denies any recurrent stroke like symptoms. However, she stopped taking all her medications as she does not feel she needs those medications. She continued to complain of severe memory loss, dyslexia, fatigue, labile emotions, struggling with conversation, lack of action or planning, delay in execution, etc. BP 133/83.   REVIEW OF SYSTEMS: Full 14 system review of systems performed and notable only for those listed below and in HPI above, all others are negative:  Constitutional:   Cardiovascular:    Ear/Nose/Throat:   Skin:  Eyes:  Double vision Respiratory:   Gastroitestinal:  nausea Genitourinary:  Hematology/Lymphatic:  anemia Endocrine: cold intolerance Musculoskeletal:   Allergy/Immunology:  Food allergy Neurological:  Memory loss, speech difficulty, weakness, dizziness Psychiatric: agitation, confusion, depression, anxiety, nervous, decreased concentration Sleep: daytime sleepiness  The following represents the patient's updated allergies and side effects list: Allergies  Allergen Reactions  . Fish-Derived Products Nausea Only    Patient stated she gets very nauseated just smelling seafood.    The neurologically relevant items on the patient's problem list were reviewed on today's visit.  Neurologic Examination  A problem focused neurological exam (12 or more points of the single system neurologic examination, vital signs counts as 1 point, cranial nerves count for 8 points) was performed.  Blood pressure 133/83, pulse 85, height  (1.702 m), weight 214 lb 9.6 oz (97.342 kg).  General - Well nourished, well developed, in no apparent distress.  Ophthalmologic - Sharp disc margins OU.   Cardiovascular - Regular rate and rhythm with no murmur.  Mental Status -  Level of arousal and orientation to time, place, and person were intact. Language including expression, naming, repetition, comprehension was assessed and found intact. Fund of Knowledge was assessed and was intact.  Cranial Nerves II - XII - II - Visual field intact OU. III, IV, VI - Extraocular movements intact. V - Facial sensation intact bilaterally. VII - Facial movement intact bilaterally. VIII - Hearing & vestibular intact bilaterally. X - Palate elevates symmetrically. XI - Chin turning & shoulder shrug  intact bilaterally. XII - Tongue protrusion intact.  Motor Strength - The patient's strength was normal in all extremities and pronator drift was absent.  Bulk was normal and  fasciculations were absent.   Motor Tone - Muscle tone was assessed at the neck and appendages and was normal.  Reflexes - The patient's reflexes were 1+ in all extremities and she had no pathological reflexes.  Sensory - Light touch, temperature/pinprick, vibration and proprioception, and Romberg testing were assessed and were normal.    Coordination - The patient had normal movements in the hands and feet with no ataxia or dysmetria.  Tremor was absent.  Gait and Station - The patient's transfers, posture, gait, station, and turns were observed as normal.  Data reviewed: I personally reviewed the images and agree with the radiology interpretations.  Ct Angio Head and Neck W/cm &/or Wo Cm 09/24/2014  1. 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 2. The left internal carotid artery is reconstituted at the level of the left ophthalmic artery and posterior communicating artery.  3. The A1 and M1 segments are symmetric.  4. No other focal vascular injury or intracranial disease.   CTA head and neck 11/03/14 Subacute infarct right posterior frontal lobe as noted on prior studies. No new area of infarction Occlusion of the proximal right internal carotid artery on the prior CTA has recanalized and is now patent. No evidence of residual intimal flap or aneurysm. No evidence of fibromuscular dysplasia. Right internal carotid artery has nearly returned to normal caliber Normal left internal carotid artery and normal vertebral artery bilaterally.  Mr Brain Wo Contrast 09/25/2014  1. Patchy multi focal acute right MCA territory infarcts involving the posterior right frontal and right parietal lobes. There is small amount of associated petechial hemorrhage without hemorrhagic transformation.  2. Additional tiny 4 mm acute ischemic infarct within the body of the right caudate nucleus.  3. Abnormal flow void within the petrous and  proximal cavernous right ICA, consistent with known right ICA dissection.   Dg Chest Port 1 View 09/25/2014  No active disease.   2D echo -  Left ventricle: The cavity size was normal. Systolic function was normal. The estimated ejection fraction was in the range of 55% to 60%. Wall motion was normal; there were no regional wall motion abnormalities. Left ventricular diastolic function parameters were normal.  Component     Latest Ref Rng 09/25/2014  Cholesterol     0 - 200 mg/dL 161  Triglycerides     <150 mg/dL 49  HDL Cholesterol     >40 mg/dL 50  Total CHOL/HDL Ratio      2.9  VLDL     0 - 40 mg/dL 10  LDL (calc)     0 - 99 mg/dL 84  Hemoglobin W9U     4.8 - 5.6 % 5.7 (H)  Mean Plasma Glucose      117  TSH     0.350 - 4.500 uIU/mL 1.309  Vitamin B-12     180 - 914 pg/mL 251  Folate     >5.9 ng/mL 13.0  Sed Rate     0 - 22 mm/hr 50 (H)  CRP     <1.0 mg/dL 0.6  ANA Ab, IFA      Negative    Assessment: As you may recall, she is a 30 y.o. African American female with PMH of no significant past medical history was admitted on 09/24/14 for  presenting with headache, right upper extremity numbness, nausea and vomiting after a roller coaster ride. 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 TEE and A1C unremarkable and LDL 84. Her symptoms resolved and she was discharged with ASA and lipitor 10. On 11/03/14 she developed nausea vomiting and repeat CTA head and neck done showed right ICA has recannulizated. Her BP 141/89 at that time and usually her BP 120/80. Etiology for her N/V not clear, but could be due to elevated BP in the setting of recannulization of right ICA (hyperperfusion syndrome). She also complains of emotional outburst, anxiety and depression after the anxiety spells. She felt not able to function well after taking lipitor in the evenings. Lipitor d/c'ed. Put on celexa.  However, during the interval time, she stopped ASA and celexa by herself. She continues to complain of severe memory loss, dyslexia, fatigue, labile emotions, struggling with conversation, lack of action or planning, delay in execution, etc, consistent with frontal lobe dysfunction, depression and anxiety with adjustment disorder. She agrees to resume ASA and celexa as well as psychology counseling.  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  I spent more than 25 minutes of face to face time with the patient. Greater than 50% of time was spent in counseling and coordination of care. We have discussed about anxiety and depression post stroke and treatment [name].   Orders Placed This Encounter  Procedures  . Ambulatory referral to Psychology    Referral Priority:  Routine    Referral Type:  Psychiatric    Referral Reason:  Specialty Services Required    Requested Specialty:  Psychology    Number of Visits Requested:  1    No orders of the defined types were placed in this encounter.    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    Alexandria PlanJindong Savon Bordonaro, MD PhD [facility] [address], [address] [place], [place] [id number] [phone]

## 2015-07-04 ENCOUNTER — Telehealth: Payer: Self-pay | Admitting: Neurology

## 2015-07-04 NOTE — Telephone Encounter (Signed)
Pt called and said [facility] does not accept her insurance. Is there another physician she can go see? Please call and advise

## 2015-07-04 NOTE — Telephone Encounter (Signed)
Rn call patient back about needing another name for a psychologist.Pt stated Dr. Roda ShuttersXu recommended [facility]. Rn stated she would need to call her insurance company to find out who is in network for a psychologist. Rn stated when she finds one to call [facility] back with a name and the referral can be sent there.

## 2015-07-15 ENCOUNTER — Other Ambulatory Visit: Payer: Self-pay | Admitting: Neurology

## 2015-07-19 NOTE — Telephone Encounter (Signed)
Per last OV note, you mentioned pt should resume celexa. I do not see rx in epic. If ok, can you place order for med? Thank you!

## 2015-08-24 ENCOUNTER — Ambulatory Visit: Payer: Commercial Managed Care - HMO | Admitting: Psychiatry

## 2015-08-31 ENCOUNTER — Ambulatory Visit (INDEPENDENT_AMBULATORY_CARE_PROVIDER_SITE_OTHER): Payer: 59 | Admitting: Psychiatry

## 2015-08-31 DIAGNOSIS — F4323 Adjustment disorder with mixed anxiety and depressed mood: Secondary | ICD-10-CM | POA: Diagnosis not present

## 2015-09-14 ENCOUNTER — Ambulatory Visit (INDEPENDENT_AMBULATORY_CARE_PROVIDER_SITE_OTHER): Payer: 59 | Admitting: Psychiatry

## 2015-09-14 DIAGNOSIS — F4323 Adjustment disorder with mixed anxiety and depressed mood: Secondary | ICD-10-CM

## 2015-09-21 ENCOUNTER — Ambulatory Visit (INDEPENDENT_AMBULATORY_CARE_PROVIDER_SITE_OTHER): Payer: 59 | Admitting: Psychiatry

## 2015-09-21 DIAGNOSIS — F4323 Adjustment disorder with mixed anxiety and depressed mood: Secondary | ICD-10-CM

## 2015-10-03 ENCOUNTER — Encounter: Payer: Self-pay | Admitting: Neurology

## 2015-10-03 ENCOUNTER — Ambulatory Visit (INDEPENDENT_AMBULATORY_CARE_PROVIDER_SITE_OTHER): Payer: Commercial Managed Care - HMO | Admitting: Neurology

## 2015-10-03 VITALS — BP 110/75 | HR 87 | Ht 67.0 in | Wt 233.0 lb

## 2015-10-03 DIAGNOSIS — I63131 Cerebral infarction due to embolism of right carotid artery: Secondary | ICD-10-CM | POA: Diagnosis not present

## 2015-10-03 DIAGNOSIS — I7771 Dissection of carotid artery: Secondary | ICD-10-CM | POA: Diagnosis not present

## 2015-10-03 DIAGNOSIS — F329 Major depressive disorder, single episode, unspecified: Secondary | ICD-10-CM

## 2015-10-03 DIAGNOSIS — F411 Generalized anxiety disorder: Secondary | ICD-10-CM | POA: Diagnosis not present

## 2015-10-03 DIAGNOSIS — F32A Depression, unspecified: Secondary | ICD-10-CM

## 2015-10-03 NOTE — Patient Instructions (Addendum)
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

## 2015-10-03 NOTE — Progress Notes (Signed)
STROKE NEUROLOGY FOLLOW UP NOTE  NAME: Alexandria [name] DOB: May 23, 1985  REASON FOR VISIT: stroke follow up HISTORY FROM: pt and chart  Today we had the pleasure of seeing Blue Ruggerio in follow-up at our [facility]rology [facility]. Pt was accompanied by no one.   History Summary Ms. Shanvi Moyd is a 30 y.o. female with no significant past medical history presenting with headache, right upper extremity numbness, nausea and vomiting after a roller coaster ride. 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 TEE and A1C unremarkable and LDL 84. Her symptoms resolved and she was discharged with ASA and lipitor 10 and plan for repeat CTA in 3 months.    11/30/14 follow up - the patient has been doing well until 11/03/14 when she developed nausea vomiting and some occipital HA. She went to ED and had CTA head and neck done showed right ICA has recannulizated. She was told to have gastritis and prescribed zofran PRN. During that visit, her BP 141/89. However, regularly her BP 120/80 as today in clinic. Since then, pt has been doing well. No acute issues.  She also complains of emotional outburst, anxiety and depression after the anxiety spells. She felt not able to function well after taking lipitor in the evenings. Other than that, she has been back to work and doing reasonably well.   06/28/15 follow up - pt denies any recurrent stroke like symptoms. However, she stopped taking all her medications as she does not feel she needs those medications. She continued to complain of severe memory loss, dyslexia, fatigue, labile emotions, struggling with conversation, lack of action or planning, delay in execution, etc. BP 133/83.   Interval History During the interval time, pt has been doing well. Taking ASA and celexa, felt help for depression and labile emotions. Also had psychology following up and stated very helpful. Less anxiety and  stress now. Would like to go back to full time job. BP 110/75.   REVIEW OF SYSTEMS: Full 14 system review of systems performed and notable only for those listed below and in HPI above, all others are negative:  Constitutional:  fatigue Cardiovascular:  Ear/Nose/Throat:   Skin:  Eyes:  Double vision Respiratory:   Gastroitestinal:  nausea Genitourinary:  Hematology/Lymphatic:  Anemia, bleeding easily Endocrine:  Musculoskeletal:   Allergy/Immunology:   Neurological:  Memory loss Psychiatric: agitation, nervous, anxious Sleep: daytime sleepiness  The following represents the patient's updated allergies and side effects list: Allergies  Allergen Reactions  . Fish-Derived Products Nausea Only    Patient stated she gets very nauseated just smelling seafood.    The neurologically relevant items on the patient's problem list were reviewed on today's visit.  Neurologic Examination  A problem focused neurological exam (12 or more points of the single system neurologic examination, vital signs counts as 1 point, cranial nerves count for 8 points) was performed.  Blood pressure 110/75, pulse 87, height 5\' 7"  (1.702 m), weight 233 lb (105.7 kg).  General - Well nourished, well developed, in no apparent distress.  Ophthalmologic - Sharp disc margins OU.   Cardiovascular - Regular rate and rhythm with no murmur.  Mental Status -  Level of arousal and orientation to time, place, and person were intact. Language including expression, naming, repetition, comprehension was assessed and found intact. Fund of Knowledge was assessed and was intact.  Cranial Nerves II - XII - II - Visual field intact OU. III, IV,  VI - Extraocular movements intact. V - Facial sensation intact bilaterally. VII - Facial movement intact bilaterally. VIII - Hearing & vestibular intact bilaterally. X - Palate elevates symmetrically. XI - Chin turning & shoulder shrug intact bilaterally. XII - Tongue  protrusion intact.  Motor Strength - The patient's strength was normal in all extremities and pronator drift was absent.  Bulk was normal and fasciculations were absent.   Motor Tone - Muscle tone was assessed at the neck and appendages and was normal.  Reflexes - The patient's reflexes were 1+ in all extremities and she had no pathological reflexes.  Sensory - Light touch, temperature/pinprick, vibration and proprioception, and Romberg testing were assessed and were normal.    Coordination - The patient had normal movements in the hands and feet with no ataxia or dysmetria.  Tremor was absent.  Gait and Station - The patient's transfers, posture, gait, station, and turns were observed as normal.  Data reviewed: I personally reviewed the images and agree with the radiology interpretations.  Ct Angio Head and Neck W/cm &/or Wo Cm 09/24/2014  1. 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 2. The left internal carotid artery is reconstituted at the level of the left ophthalmic artery and posterior communicating artery.  3. The A1 and M1 segments are symmetric.  4. No other focal vascular injury or intracranial disease.   CTA head and neck 11/03/14 Subacute infarct right posterior frontal lobe as noted on prior studies. No new area of infarction Occlusion of the proximal right internal carotid artery on the prior CTA has recanalized and is now patent. No evidence of residual intimal flap or aneurysm. No evidence of fibromuscular dysplasia. Right internal carotid artery has nearly returned to normal caliber Normal left internal carotid artery and normal vertebral artery bilaterally.  Mr Brain Wo Contrast 09/25/2014  1. Patchy multi focal acute right MCA territory infarcts involving the posterior right frontal and right parietal lobes. There is small amount of associated petechial hemorrhage without hemorrhagic transformation.   2. Additional tiny 4 mm acute ischemic infarct within the body of the right caudate nucleus.  3. Abnormal flow void within the petrous and proximal cavernous right ICA, consistent with known right ICA dissection.   Dg Chest Port 1 View 09/25/2014  No active disease.   2D echo -  Left ventricle: The cavity size was normal. Systolic function was normal. The estimated ejection fraction was in the range of 55% to 60%. Wall motion was normal; there were no regional wall motion abnormalities. Left ventricular diastolic function parameters were normal.  Component     Latest Ref Rng 09/25/2014  Cholesterol     0 - 200 mg/dL 161144  Triglycerides     <150 mg/dL 49  HDL Cholesterol     >40 mg/dL 50  Total CHOL/HDL Ratio      2.9  VLDL     0 - 40 mg/dL 10  LDL (calc)     0 - 99 mg/dL 84  Hemoglobin [id number]     4.8 - 5.6 % 5.7 (H)  Mean Plasma Glucose      117  TSH     0.350 - 4.500 uIU/mL 1.309  Vitamin B-12     180 - 914 pg/mL 251  Folate     >5.9 ng/mL 13.0  Sed Rate     0 - 22 mm/hr 50 (H)  CRP     <1.0 mg/dL 0.6  ANA Ab, IFA  Negative    Assessment: As you may recall, she is a 30 y.o. African American female with PMH of no significant past medical history was admitted on 09/24/14 for  presenting with headache, right upper extremity numbness, nausea and vomiting after a roller coaster ride. 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 TEE and A1C unremarkable and LDL 84. Her symptoms resolved and she was discharged with ASA and lipitor 10. On 11/03/14 she developed nausea vomiting and repeat CTA head and neck done showed right ICA has recannulizated. Her BP 141/89 at that time and usually her BP 120/80. Etiology for her N/V not clear, but could be due to elevated BP in the setting of recannulization of right ICA (hyperperfusion syndrome). She also complains of emotional outburst, anxiety and  depression after the anxiety spells. She felt not able to function well after taking lipitor in the evenings. Lipitor d/c'ed. Put on celexa. However, she stopped ASA and celexa by herself. She continues to complain of severe memory loss, dyslexia, fatigue, labile emotions, struggling with conversation, lack of action or planning, delay in execution, etc, consistent with depression and anxiety with adjustment disorder. Resumed ASA and celexa as well as started psychology counseling. She felt great since then.  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   No orders of the defined types were placed in this encounter.   No orders of the defined types were placed in this encounter.   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   Marvel Plan, MD PhD [facility] [address], [address] [place], [place] [address] [phone]

## 2016-02-08 ENCOUNTER — Other Ambulatory Visit: Payer: Self-pay | Admitting: Neurology

## 2016-04-27 ENCOUNTER — Other Ambulatory Visit: Payer: Self-pay | Admitting: Neurology

## 2016-04-30 ENCOUNTER — Ambulatory Visit: Payer: Commercial Managed Care - HMO | Admitting: Family Medicine

## 2016-07-19 IMAGING — CT CT ANGIO NECK
2 of 10 series · 6 of 35 positions shown · IV contrast (omnipaque)
Comparison: The MRI and CT 09/02/2014

CLINICAL DATA: History of right carotid dissection and right MCA
stroke. Dizziness and headache.

EXAM:
CT ANGIOGRAPHY HEAD AND NECK
TECHNIQUE: Multidetector CT imaging of the head and neck was performed using
the standard protocol during bolus administration of intravenous
contrast. Multiplanar CT image reconstructions and MIPs were
obtained to evaluate the vascular anatomy. Carotid stenosis
measurements (when applicable) are obtained utilizing NASCET
criteria, using the distal internal carotid diameter as the
denominator.
CONTRAST:  100mL OMNIPAQUE IOHEXOL 350 MG/ML SOLN

[Series 7: axial thin · axial · 0.39mm/px · z∈[-273,-70]mm · 5 of 326 slices shown]
[im 55/326  soft-tissue]
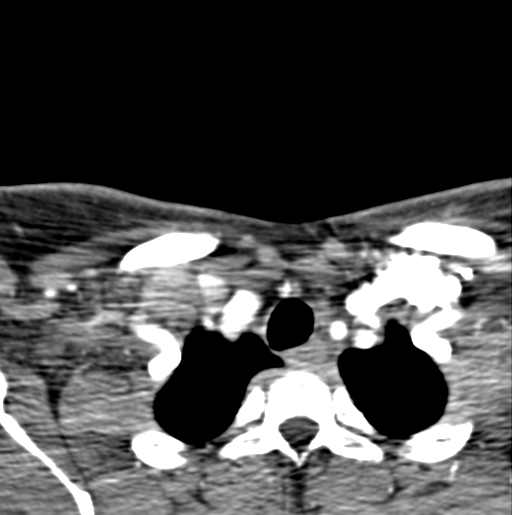
[im 109/326  bone]
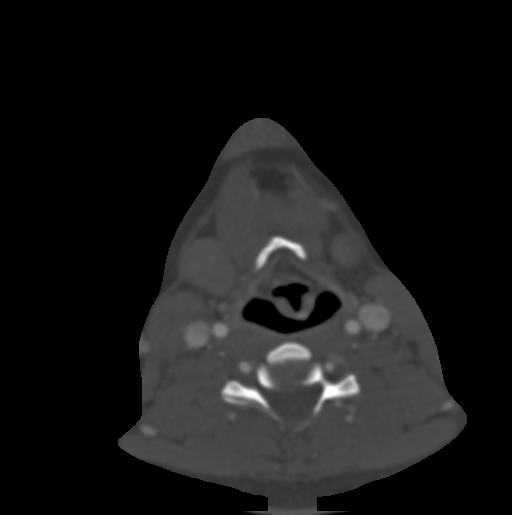
[im 163/326  soft-tissue]
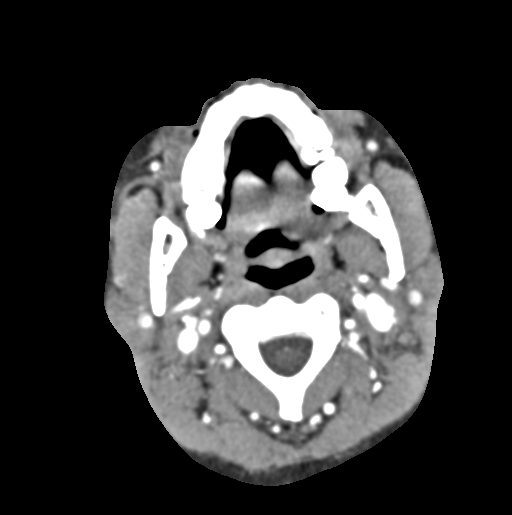
[im 217/326  bone]
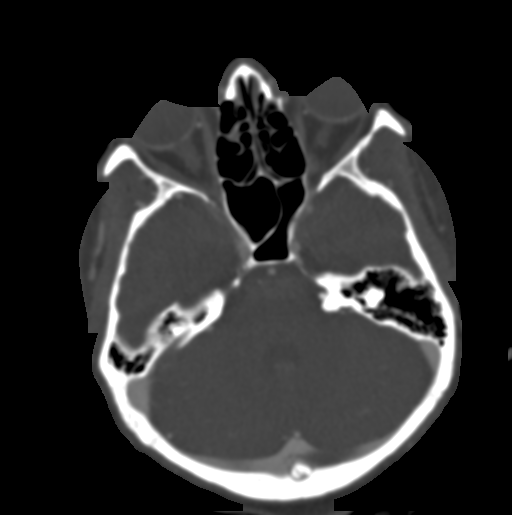
[im 271/326  soft-tissue]
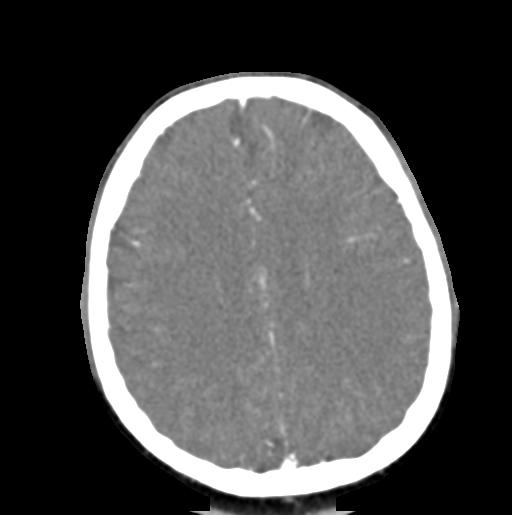

[Series 11: sagittal thin · sagittal · 0.55mm/px · 1 of 178 slices shown]
[im 115/178  soft-tissue]
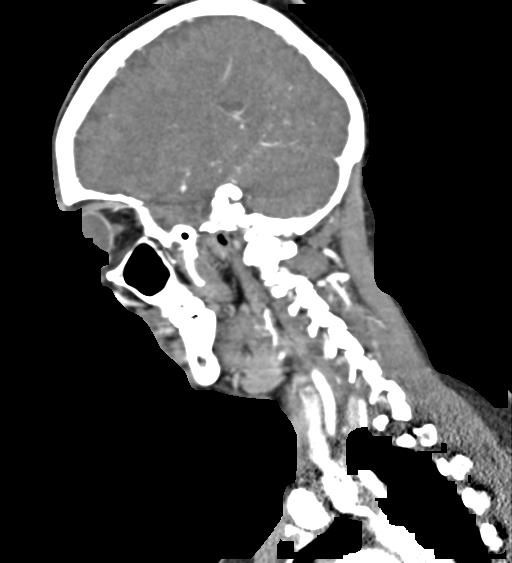

[6 of 35 positions shown; findings below may reference images not displayed]

FINDINGS: CT HEAD

Brain: Hypodensity right posterior frontal lobe compatible with
resolving infarct. This area showed restricted diffusion on the
prior MRI. No new area of acute infarction. Negative for hemorrhage
or mass. Ventricle size normal.

Calvarium and skull base: Negative

Paranasal sinuses: Negative

Orbits: Negative

CTA NECK

Aortic arch: Normal aortic arch.  Proximal great vessels normal.

Right carotid system: Right common carotid artery widely patent.
Prior study demonstrated occluded right internal carotid artery just
above the bifurcation compatible with dissection. This has
recanalized and is now patent. The right internal carotid artery is
mildly narrowed and nearly returned to normal caliber. No intimal
flap or aneurysm identified. Negative for fibromuscular dysplasia.

Left carotid system: Normal

Vertebral arteries:Both vertebral arteries widely patent the basilar
without stenosis or dissection.

Skeleton: Negative

Other neck: Negative

CTA HEAD

Anterior circulation: Cavernous carotid is patent bilaterally
without significant stenosis. Anterior and middle cerebral arteries
widely patent without stenosis or aneurysm.

Posterior circulation: Both vertebral arteries patent to the
basilar. PICA patent bilaterally. Basilar patent. Left AICA patent.
Superior cerebellar and posterior cerebral arteries patent
bilaterally.

Venous sinuses: Patent

Anatomic variants: None

Delayed phase: Normal enhancement on delayed imaging.
IMPRESSION: Subacute infarct right posterior frontal lobe as noted on prior
studies. No new area of infarction

Occlusion of the proximal right internal carotid artery on the prior
CTA has recanalized and is now patent. No evidence of residual
intimal flap or aneurysm. No evidence of fibromuscular dysplasia.
Right internal carotid artery has nearly returned to normal caliber

Normal left internal carotid artery and normal vertebral artery
bilaterally.

## 2016-09-17 ENCOUNTER — Telehealth: Payer: Self-pay

## 2016-09-17 ENCOUNTER — Ambulatory Visit: Payer: Commercial Managed Care - HMO | Admitting: Neurology

## 2016-09-17 NOTE — Telephone Encounter (Signed)
PATIENT WAS NO SHOW FOR APPT TODAY.
